# Patient Record
Sex: Female | Born: 1960 | Race: White | Hispanic: No | Marital: Married | Smoking: Former smoker
Health system: Southern US, Community
[De-identification: ages and names within clinical notes are randomized; demographics above are authoritative.]

## PROBLEM LIST (undated history)

## (undated) DIAGNOSIS — Z8781 Personal history of (healed) traumatic fracture: Secondary | ICD-10-CM

## (undated) DIAGNOSIS — K5790 Diverticulosis of intestine, part unspecified, without perforation or abscess without bleeding: Secondary | ICD-10-CM

## (undated) DIAGNOSIS — Z8619 Personal history of other infectious and parasitic diseases: Secondary | ICD-10-CM

## (undated) HISTORY — DX: Personal history of (healed) traumatic fracture: Z87.81

## (undated) HISTORY — DX: Diverticulosis of intestine, part unspecified, without perforation or abscess without bleeding: K57.90

## (undated) HISTORY — DX: Personal history of other infectious and parasitic diseases: Z86.19

---

## 1998-08-09 HISTORY — PX: TUBAL LIGATION: SHX77

## 2009-02-12 ENCOUNTER — Ambulatory Visit: Payer: Self-pay | Admitting: Sports Medicine

## 2010-09-15 LAB — HM DEXA SCAN: HM DEXA SCAN: NORMAL

## 2010-10-12 LAB — HM COLONOSCOPY

## 2013-04-17 LAB — CBC AND DIFFERENTIAL
HCT: 38 % (ref 36–46)
Hemoglobin: 12.8 g/dL (ref 12.0–16.0)
PLATELETS: 244 10*3/uL (ref 150–399)
WBC: 6.4 10*3/mL

## 2013-04-17 LAB — BASIC METABOLIC PANEL
BUN: 16 mg/dL (ref 4–21)
CREATININE: 0.8 mg/dL (ref 0.5–1.1)
Glucose: 100 mg/dL
Potassium: 3.7 mmol/L (ref 3.4–5.3)
SODIUM: 142 mmol/L (ref 137–147)

## 2013-04-17 LAB — HEPATIC FUNCTION PANEL
ALT: 14 U/L (ref 7–35)
AST: 8 U/L — AB (ref 13–35)

## 2014-01-02 LAB — HM PAP SMEAR: HM Pap smear: NEGATIVE

## 2014-01-09 LAB — HM MAMMOGRAPHY

## 2014-04-16 ENCOUNTER — Ambulatory Visit: Payer: Self-pay | Admitting: Orthopedic Surgery

## 2015-08-21 ENCOUNTER — Encounter: Payer: Self-pay | Admitting: Family Medicine

## 2015-08-21 ENCOUNTER — Ambulatory Visit (INDEPENDENT_AMBULATORY_CARE_PROVIDER_SITE_OTHER): Payer: 59 | Admitting: Family Medicine

## 2015-08-21 VITALS — BP 102/60 | HR 76 | Temp 98.5°F | Resp 16 | Wt 168.0 lb

## 2015-08-21 DIAGNOSIS — E669 Obesity, unspecified: Secondary | ICD-10-CM | POA: Insufficient documentation

## 2015-08-21 DIAGNOSIS — M5412 Radiculopathy, cervical region: Secondary | ICD-10-CM | POA: Insufficient documentation

## 2015-08-21 DIAGNOSIS — R05 Cough: Secondary | ICD-10-CM

## 2015-08-21 DIAGNOSIS — R059 Cough, unspecified: Secondary | ICD-10-CM

## 2015-08-21 DIAGNOSIS — Z8781 Personal history of (healed) traumatic fracture: Secondary | ICD-10-CM | POA: Insufficient documentation

## 2015-08-21 DIAGNOSIS — Z87891 Personal history of nicotine dependence: Secondary | ICD-10-CM | POA: Insufficient documentation

## 2015-08-21 DIAGNOSIS — J40 Bronchitis, not specified as acute or chronic: Secondary | ICD-10-CM

## 2015-08-21 DIAGNOSIS — K5730 Diverticulosis of large intestine without perforation or abscess without bleeding: Secondary | ICD-10-CM | POA: Insufficient documentation

## 2015-08-21 MED ORDER — AZITHROMYCIN 250 MG PO TABS: ORAL_TABLET | ORAL | Status: AC

## 2015-08-21 MED ORDER — HYDROCODONE-HOMATROPINE 5-1.5 MG/5ML PO SYRP
5.0000 mL | ORAL_SOLUTION | Freq: Three times a day (TID) | ORAL | Status: DC | PRN
Start: 2015-08-21 — End: 2016-09-21

## 2015-08-21 NOTE — Patient Instructions (Signed)

## 2015-08-21 NOTE — Progress Notes (Signed)
       Patient: Shelly Hicks Female    DOB: 1960/12/29   55 y.o.   MRN: [id number] Visit Date: 08/21/2015  Today's Provider: Lelon Huh, MD   Chief Complaint  Patient presents with  . Cough   Subjective:    Cough This is a new problem. The current episode started 1 to 4 weeks ago (x2 weeks). The problem has been unchanged. The problem occurs every few minutes. The cough is non-productive. Associated symptoms include headaches, nasal congestion, postnasal drip and rhinorrhea. Pertinent negatives include no chest pain, chills, ear congestion, ear pain, fever, heartburn, hemoptysis, myalgias, rash, sore throat, shortness of breath, sweats or wheezing. Associated symptoms comments: Heavy feeling in her chest when she coughs. The symptoms are aggravated by lying down (heat). She has tried prescription cough suppressant (mucinex and alka seltzer) for the symptoms. The treatment provided no relief. Her past medical history is significant for bronchitis.   Cough started 2 weeks ago with sinus pressure. Cough has lingered, has pressure in chest while coughing. Cough is worse at night. Productive of yellow sputum.    No Known Allergies Previous Medications   No medications on file    Review of Systems  Constitutional: Negative for fever, chills, appetite change and fatigue.  HENT: Positive for congestion, postnasal drip, rhinorrhea and sinus pressure. Negative for ear discharge, ear pain and sore throat.   Respiratory: Positive for cough. Negative for hemoptysis, chest tightness, shortness of breath and wheezing.   Cardiovascular: Negative for chest pain and palpitations.  Gastrointestinal: Negative for heartburn, nausea, vomiting and abdominal pain.  Musculoskeletal: Negative for myalgias.  Skin: Negative for rash.  Neurological: Positive for headaches. Negative for dizziness and weakness.    Social History  Substance Use Topics  . Smoking status: Former Smoker -- 1.00 packs/day      Types: Cigarettes    Quit date: 08/09/2009  . Smokeless tobacco: Not on file  . Alcohol Use: 0.0 oz/week    0 Standard drinks or equivalent per week     Comment: occasional use   Objective:   BP 102/60 mmHg  Pulse 76  Temp(Src) 98.5 F (36.9 C) (Oral)  Resp 16  Wt 168 lb (76.204 kg)  LMP 08/20/2010 (Approximate)  Physical Exam  General Appearance:    Alert, cooperative, no distress  HENT:   ENT exam normal, no neck nodes or sinus tenderness, neck without nodes, sinuses nontender and nasal mucosa pale and congested  Eyes:    PERRL, conjunctiva/corneas clear, EOM's intact       Lungs:     Occasional expiratory wheeze. , respirations unlabored  Heart:    Regular rate and rhythm  Neurologic:   Awake, alert, oriented x 3. No apparent focal neurological           defect.           Assessment & Plan:     1. Cough  - HYDROcodone-homatropine (HYCODAN) 5-1.5 MG/5ML syrup; Take 5 mLs by mouth every 8 (eight) hours as needed for cough.  Dispense: 120 mL; Refill: 0  2. Bronchitis  - azithromycin (ZITHROMAX) 250 MG tablet; 2 by mouth today, then 1 daily for 4 days  Dispense: 6 tablet; Refill: 0  Call if symptoms change or if not rapidly improving.           Lelon Huh, MD  [facility]

## 2016-04-09 HISTORY — PX: SHOULDER SURGERY: SHX246

## 2016-09-21 ENCOUNTER — Ambulatory Visit (INDEPENDENT_AMBULATORY_CARE_PROVIDER_SITE_OTHER): Payer: 59 | Admitting: Family Medicine

## 2016-09-21 ENCOUNTER — Encounter: Payer: Self-pay | Admitting: Family Medicine

## 2016-09-21 VITALS — BP 120/80 | HR 83 | Temp 98.2°F | Resp 16 | Wt 171.0 lb

## 2016-09-21 DIAGNOSIS — J329 Chronic sinusitis, unspecified: Secondary | ICD-10-CM | POA: Diagnosis not present

## 2016-09-21 MED ORDER — AZITHROMYCIN 250 MG PO TABS: ORAL_TABLET | ORAL | 0 refills | Status: AC

## 2016-09-21 NOTE — Patient Instructions (Signed)

## 2016-09-21 NOTE — Progress Notes (Signed)
       Patient: Shelly Hicks Female    DOB: [birth year]   56 y.o.   MRN: [id number] Visit Date: 09/21/2016  Today's Provider: Lelon Huh, MD   Chief Complaint  Patient presents with  . Sinusitis   Subjective:    Patient stated 4 days ago she started feeling feverish and chilled. Then over the weekend she developed a cough. Patient stated that yesterday sinus pressure and headache started. Believes she had a fever 4 days ago. Patient has been taking otc tylenol flu and antihistamines with mild to no relief.   Sinusitis  This is a new problem. The current episode started in the past 7 days (4 days ago). The problem has been gradually worsening since onset. There has been no fever. The pain is moderate. Associated symptoms include chills, congestion, coughing, headaches, sinus pressure and sneezing. Pertinent negatives include no diaphoresis, ear pain, hoarse voice, neck pain, shortness of breath, sore throat or swollen glands. Treatments tried: tylenol flu. The treatment provided mild relief.      No Known Allergies   Current Outpatient Prescriptions:  .  HYDROcodone-homatropine (HYCODAN) 5-1.5 MG/5ML syrup, Take 5 mLs by mouth every 8 (eight) hours as needed for cough., Disp: 120 mL, Rfl: 0  Review of Systems  Constitutional: Positive for chills. Negative for appetite change, diaphoresis, fatigue and fever.  HENT: Positive for congestion, sinus pain, sinus pressure and sneezing. Negative for ear pain, hoarse voice and sore throat.   Respiratory: Positive for cough. Negative for chest tightness, shortness of breath and wheezing.   Cardiovascular: Negative for chest pain and palpitations.  Gastrointestinal: Negative for abdominal pain, nausea and vomiting.  Musculoskeletal: Negative for neck pain.  Neurological: Positive for headaches. Negative for dizziness and weakness.    Social History  Substance Use Topics  . Smoking status: Former Smoker    Packs/day: 1.00   Types: Cigarettes    Quit date: 08/09/2009  . Smokeless tobacco: Never Used  . Alcohol use 0.0 oz/week     Comment: occasional use   Objective:   BP 120/80 (BP Location: Right Arm, Patient Position: Sitting, Cuff Size: Normal)   Pulse 83   Temp 98.2 F (36.8 C) (Oral)   Resp 16   Wt 171 lb (77.6 kg)   LMP 08/20/2010 (Approximate)   SpO2 98%   BMI 26.78 kg/m   Physical Exam  General Appearance:    Alert, cooperative, no distress  HENT:   bilateral TM normal without fluid or infection, neck without nodes, fronal sinuses tender and nasal mucosa pale and congested  Eyes:    PERRL, conjunctiva/corneas clear, EOM's intact       Lungs:     Clear to auscultation bilaterally, respirations unlabored  Heart:    Regular rate and rhythm  Neurologic:   Awake, alert, oriented x 3. No apparent focal neurological           defect.           Assessment & Plan:     1. Sinusitis, unspecified chronicity, unspecified location  - azithromycin (ZITHROMAX) 250 MG tablet; 2 by mouth today, then 1 daily for 4 days  Dispense: 6 tablet; Refill: 0  Call if symptoms change or if not rapidly improving.          Lelon Huh, MD  [facility]

## 2016-12-17 ENCOUNTER — Encounter: Payer: Self-pay | Admitting: Family Medicine

## 2016-12-17 ENCOUNTER — Ambulatory Visit (INDEPENDENT_AMBULATORY_CARE_PROVIDER_SITE_OTHER): Payer: 59 | Admitting: Family Medicine

## 2016-12-17 VITALS — BP 102/72 | HR 66 | Temp 98.2°F | Wt 169.2 lb

## 2016-12-17 DIAGNOSIS — R350 Frequency of micturition: Secondary | ICD-10-CM | POA: Diagnosis not present

## 2016-12-17 DIAGNOSIS — N3090 Cystitis, unspecified without hematuria: Secondary | ICD-10-CM

## 2016-12-17 LAB — POCT URINALYSIS DIPSTICK
BILIRUBIN UA: NEGATIVE
Glucose, UA: NEGATIVE
Ketones, UA: NEGATIVE
NITRITE UA: NEGATIVE
UROBILINOGEN UA: 0.2 U/dL
pH, UA: 6 (ref 5.0–8.0)

## 2016-12-17 MED ORDER — NITROFURANTOIN MONOHYD MACRO 100 MG PO CAPS: 100.0000 mg | ORAL_CAPSULE | Freq: Two times a day (BID) | ORAL | 0 refills | Status: DC

## 2016-12-17 NOTE — Progress Notes (Signed)
   Patient: Shelly Hicks Female    DOB: 06/09/1961   56 y.o.   MRN: [id number] Visit Date: 12/17/2016  Today's Provider: Vernie Murders, PA   Chief Complaint  Patient presents with  . Urinary Tract Infection   Subjective:    Dysuria   This is a new problem. Episode onset: couple weeks ago. The problem has been gradually worsening. Quality: pressure. Associated symptoms include frequency. Associated symptoms comments: Pressure . She has tried nothing for the symptoms.   Past Medical History:  Diagnosis Date  . Diverticulosis   . History of chicken pox   . History of measles   . History of mumps   . History of rib fracture    Past Surgical History:  Procedure Laterality Date  . TUBAL LIGATION  2000   Family History  Problem Relation Age of Onset  . Heart disease Neg Hx   . Breast cancer Neg Hx   . Colon cancer Neg Hx    No Known Allergies   Previous Medications   No medications on file    Review of Systems  Constitutional: Negative.   Respiratory: Negative.   Cardiovascular: Negative.   Genitourinary: Positive for dysuria and frequency.    Social History  Substance Use Topics  . Smoking status: Former Smoker    Packs/day: 1.00    Types: Cigarettes    Quit date: 08/09/2009  . Smokeless tobacco: Never Used  . Alcohol use 0.0 oz/week     Comment: occasional use   Objective:   BP 102/72 (BP Location: Right Arm, Patient Position: Sitting, Cuff Size: Normal)   Pulse 66   Temp 98.2 F (36.8 C) (Oral)   Wt 169 lb 3.2 oz (76.7 kg)   LMP 08/20/2010 (Approximate)   SpO2 97%   BMI 26.50 kg/m   Physical Exam  Constitutional: She is oriented to person, place, and time. She appears well-developed and well-nourished. No distress.  HENT:  Head: Normocephalic and atraumatic.  Right Ear: Hearing normal.  Left Ear: Hearing normal.  Nose: Nose normal.  Eyes: Conjunctivae and lids are normal. Right eye exhibits no discharge. Left eye exhibits no discharge. No  scleral icterus.  Pulmonary/Chest: Effort normal. No respiratory distress.  Abdominal: Soft. Bowel sounds are normal. There is no tenderness.  Musculoskeletal: Normal range of motion.  Neurological: She is alert and oriented to person, place, and time.  Skin: Skin is intact. No lesion and no rash noted.  Psychiatric: She has a normal mood and affect. Her speech is normal and behavior is normal. Thought content normal.      Assessment & Plan:     1. Frequent urination Onset over the past 2 weeks. No burning or stinging with urination. No gross hematuria. Urinalysis showed TNTC WBC's with RBC's and 1-2+ bacteria/hpf. Will get urine culture and treat as cystitis.  - POCT Urinalysis Dipstick  2. Cystitis No significant abdominal tenderness or CVA tenderness to percussion posteriorly. No fever. Urinalysis as above. Treat with Macrobid and may add AZO-Standard prn discomfort. Increase fluid intake and recheck pending culture report. - Urine culture - nitrofurantoin, macrocrystal-monohydrate, (MACROBID) 100 MG capsule; Take 1 capsule (100 mg total) by mouth 2 (two) times daily.  Dispense: 14 capsule; Refill: 0

## 2016-12-17 NOTE — Patient Instructions (Signed)

## 2016-12-19 LAB — URINE CULTURE

## 2016-12-20 ENCOUNTER — Telehealth: Payer: Self-pay

## 2016-12-20 NOTE — Telephone Encounter (Signed)
LMTCB  Thanks,  -Agape Hardiman 

## 2016-12-20 NOTE — Telephone Encounter (Signed)
-----   Message from Margo Common, [place] sent at 12/20/2016  8:21 AM EDT ----- Culture isolated E.coli bacteria that is sensitive to the Nitrofurantoin (Macrobid) given. Finish all the antibiotic and recheck urinalysis if any symptoms remain in 10 days.

## 2016-12-21 NOTE — Telephone Encounter (Signed)
Advised pt of lab results. Pt verbally acknowledges understanding. Emily Drozdowski, CMA   

## 2016-12-27 ENCOUNTER — Ambulatory Visit (INDEPENDENT_AMBULATORY_CARE_PROVIDER_SITE_OTHER): Payer: 59 | Admitting: Family Medicine

## 2016-12-27 ENCOUNTER — Encounter: Payer: Self-pay | Admitting: Family Medicine

## 2016-12-27 ENCOUNTER — Encounter: Payer: Self-pay | Admitting: *Deleted

## 2016-12-27 VITALS — BP 100/58 | HR 76 | Temp 98.3°F | Resp 16 | Ht 67.0 in | Wt 168.0 lb

## 2016-12-27 DIAGNOSIS — Z87891 Personal history of nicotine dependence: Secondary | ICD-10-CM

## 2016-12-27 DIAGNOSIS — Z1159 Encounter for screening for other viral diseases: Secondary | ICD-10-CM | POA: Diagnosis not present

## 2016-12-27 DIAGNOSIS — Z0000 Encounter for general adult medical examination without abnormal findings: Secondary | ICD-10-CM | POA: Diagnosis not present

## 2016-12-27 DIAGNOSIS — Z23 Encounter for immunization: Secondary | ICD-10-CM

## 2016-12-27 NOTE — Patient Instructions (Signed)

## 2016-12-27 NOTE — Progress Notes (Signed)
Patient: Shelly Hicks, Female    DOB: Jan 16, 1961, 56 y.o.   MRN: [id number] Visit Date: 12/27/2016  Today's Provider: Lelon Huh, MD   Chief Complaint  Patient presents with  . Annual Exam   Subjective:    Annual physical exam Shelly Hicks is a 56 y.o. female who presents today for health maintenance and complete physical. She feels well. She reports exercising yes. She reports she is sleeping well.  -----------------------------------------------------------------   Review of Systems  Constitutional: Negative for chills, fatigue and fever.  HENT: Negative for congestion, ear pain, rhinorrhea, sneezing and sore throat.   Eyes: Negative.  Negative for pain and redness.  Respiratory: Negative for cough, shortness of breath and wheezing.   Cardiovascular: Negative for chest pain and leg swelling.  Gastrointestinal: Negative for abdominal pain, blood in stool, constipation, diarrhea and nausea.  Endocrine: Negative for polydipsia and polyphagia.  Genitourinary: Positive for frequency. Negative for dysuria, flank pain, hematuria, pelvic pain, vaginal bleeding and vaginal discharge.  Musculoskeletal: Negative for arthralgias, back pain, gait problem and joint swelling.  Skin: Negative for rash.  Neurological: Negative.  Negative for dizziness, tremors, seizures, weakness, light-headedness, numbness and headaches.  Hematological: Negative for adenopathy.  Psychiatric/Behavioral: Negative.  Negative for behavioral problems, confusion and dysphoric mood. The patient is not nervous/anxious and is not hyperactive.   All other systems reviewed and are negative.   Social History      She  reports that she quit smoking about 7 years ago. Her smoking use included Cigarettes. She smoked 1.00 pack per day. She has never used smokeless tobacco. She reports that she drinks alcohol. She reports that she does not use drugs.       Social History   Social History  . Marital  status: Married    Spouse name: N/A  . Number of children: 2  . Years of education: N/A   Social History Main Topics  . Smoking status: Former Smoker    Packs/day: 1.00    Types: Cigarettes    Quit date: 08/09/2009  . Smokeless tobacco: Never Used  . Alcohol use 0.0 oz/week     Comment: occasional use  . Drug use: No  . Sexual activity: Not Asked   Other Topics Concern  . None   Social History Narrative  . None    Past Medical History:  Diagnosis Date  . Diverticulosis   . History of chicken pox   . History of measles   . History of mumps   . History of rib fracture      Patient Active Problem List   Diagnosis Date Noted  . Diverticulosis of colon 08/21/2015  . History of tobacco use 08/21/2015  . History of rib fracture 08/21/2015  . Mild obesity 08/21/2015  . Radiculopathy of cervical region 08/21/2015    Past Surgical History:  Procedure Laterality Date  . TUBAL LIGATION  2000    Family History        Family Status  Relation Status  . Mother Alive  . Father Deceased at age 47  . Brother Alive  . Neg Hx (Not Specified)        Her family history is not on file.     No Known Allergies  No current outpatient prescriptions on file.   Patient Care Team: Birdie Sons, MD as PCP - General (Family Medicine) Caryn Section Kirstie Peri, MD as Referring Physician (Family Medicine) System, Provider Not In  Objective:   Vitals: BP (!) 100/58 (BP Location: Right Arm, Patient Position: Sitting, Cuff Size: Normal)   Pulse 76   Temp 98.3 F (36.8 C) (Oral)   Resp 16   Ht 5\' 7"  (1.702 m)   Wt 168 lb (76.2 kg)   LMP 08/20/2010 (Approximate)   SpO2 98%   BMI 26.31 kg/m    Vitals:   12/27/16 1409  BP: (!) 100/58  Pulse: 76  Resp: 16  Temp: 98.3 F (36.8 C)  TempSrc: Oral  SpO2: 98%  Weight: 168 lb (76.2 kg)  Height: 5\' 7"  (1.702 m)     Physical Exam   General Appearance:    Alert, cooperative, no distress, appears stated age  Head:     Normocephalic, without obvious abnormality, atraumatic  Eyes:    PERRL, conjunctiva/corneas clear, EOM's intact, fundi    benign, both eyes  Ears:    Normal TM's and external ear canals, both ears  Nose:   Nares normal, septum midline, mucosa normal, no drainage    or sinus tenderness  Throat:   Lips, mucosa, and tongue normal; teeth and gums normal  Neck:   Supple, symmetrical, trachea midline, no adenopathy;    thyroid:  no enlargement/tenderness/nodules; no carotid   bruit or JVD  Back:     Symmetric, no curvature, ROM normal, no CVA tenderness  Lungs:     Clear to auscultation bilaterally, respirations unlabored  Chest Wall:    No tenderness or deformity   Heart:    Regular rate and rhythm, S1 and S2 normal, no murmur, rub   or gallop  Breast Exam:    normal appearance, no masses or tenderness  Abdomen:     Soft, non-tender, bowel sounds active all four quadrants,    no masses, no organomegaly  Pelvic:    deferred  Extremities:   Extremities normal, atraumatic, no cyanosis or edema  Pulses:   2+ and symmetric all extremities  Skin:   Skin color, texture, turgor normal, no rashes or lesions  Lymph nodes:   Cervical, supraclavicular, and axillary nodes normal  Neurologic:   CNII-XII intact, normal strength, sensation and reflexes    throughout    Depression Screen PHQ 2/9 Scores 12/27/2016  PHQ - 2 Score 0  PHQ- 9 Score 0      Assessment & Plan:     Routine Health Maintenance and Physical Exam  Exercise Activities and Dietary recommendations Goals    None      Immunization History  Administered Date(s) Administered  . Influenza-Unspecified 05/30/2015  . Tdap 04/27/2006    Health Maintenance  Topic Date Due  . Hepatitis C Screening  [birth year]  . HIV Screening  09/03/1974  . MAMMOGRAM  09/03/2009  . TETANUS/TDAP  04/27/2016  . PAP SMEAR  01/02/2017  . INFLUENZA VACCINE  03/09/2017  . COLONOSCOPY  10/11/2020     Discussed health benefits of physical  activity, and encouraged her to engage in regular exercise appropriate for her age and condition.    --------------------------------------------------------------------  1. Annual physical exam Patient given contact information to schedule mammogram.  - Comprehensive metabolic panel - Lipid panel  2. Need for hepatitis C screening test  - Hepatitis C antibody  3. History of smoking 30 or more pack years  - CT CHEST LUNG CANCER SCREENING LOW DOSE WO CONTRAST; Future  4. Need for Td vaccine  - Td : Tetanus/diphtheria >7yo Preservative  free   Lelon Huh, MD  [facility]

## 2016-12-28 ENCOUNTER — Telehealth: Payer: Self-pay

## 2016-12-28 ENCOUNTER — Telehealth: Payer: Self-pay | Admitting: *Deleted

## 2016-12-28 DIAGNOSIS — Z87891 Personal history of nicotine dependence: Secondary | ICD-10-CM

## 2016-12-28 LAB — COMPREHENSIVE METABOLIC PANEL
ALT: 17 IU/L (ref 0–32)
AST: 14 IU/L (ref 0–40)
Albumin/Globulin Ratio: 1.4 (ref 1.2–2.2)
Albumin: 4.3 g/dL (ref 3.5–5.5)
Alkaline Phosphatase: 89 IU/L (ref 39–117)
BILIRUBIN TOTAL: 0.3 mg/dL (ref 0.0–1.2)
BUN/Creatinine Ratio: 24 — ABNORMAL HIGH (ref 9–23)
BUN: 16 mg/dL (ref 6–24)
CHLORIDE: 103 mmol/L (ref 96–106)
CO2: 26 mmol/L (ref 18–29)
Calcium: 9.4 mg/dL (ref 8.7–10.2)
Creatinine, Ser: 0.67 mg/dL (ref 0.57–1.00)
GFR calc non Af Amer: 98 mL/min/{1.73_m2} (ref >59)
GFR, EST AFRICAN AMERICAN: 113 mL/min/{1.73_m2} (ref >59)
Globulin, Total: 3 g/dL (ref 1.5–4.5)
Glucose: 103 mg/dL — ABNORMAL HIGH (ref 65–99)
POTASSIUM: 4.1 mmol/L (ref 3.5–5.2)
Sodium: 141 mmol/L (ref 134–144)
TOTAL PROTEIN: 7.3 g/dL (ref 6.0–8.5)

## 2016-12-28 LAB — LIPID PANEL
CHOLESTEROL TOTAL: 233 mg/dL — AB (ref 100–199)
Chol/HDL Ratio: 5.5 ratio — ABNORMAL HIGH (ref 0.0–4.4)
HDL: 42 mg/dL (ref >39)
LDL Calculated: 136 mg/dL — ABNORMAL HIGH (ref 0–99)
Triglycerides: 276 mg/dL — ABNORMAL HIGH (ref 0–149)
VLDL CHOLESTEROL CAL: 55 mg/dL — AB (ref 5–40)

## 2016-12-28 LAB — HEPATITIS C ANTIBODY: Hep C Virus Ab: <0.1 s/co ratio (ref 0.0–0.9)

## 2016-12-28 NOTE — Telephone Encounter (Signed)
-----   Message from Birdie Sons, MD sent at 12/28/2016  7:44 AM EDT ----- Blood sugar, kidney functions, electrolytes are all normal. Cholesterol mildly elevated at 233. Try to cut  Back on saturated fats in diet. Check labs yearly.

## 2016-12-28 NOTE — Telephone Encounter (Signed)
Received referral for initial lung cancer screening scan. Contacted patient and obtained smoking history,(former, quit 08/09/09, 30 pack year) as well as answering questions related to screening process. Patient denies signs of lung cancer such as weight loss or hemoptysis. Patient denies comorbidity that would prevent curative treatment if lung cancer were found. Patient is scheduled for shared decision making visit and CT scan on 01/04/17.

## 2016-12-28 NOTE — Telephone Encounter (Signed)
Pt advised.   Thanks,   -Chipper Koudelka

## 2016-12-28 NOTE — Telephone Encounter (Signed)
LMTCB 12/28/2016  Thanks,   -Mickel Baas

## 2017-01-04 ENCOUNTER — Encounter: Payer: Self-pay | Admitting: Oncology

## 2017-01-04 ENCOUNTER — Inpatient Hospital Stay: Payer: Self-pay | Admitting: Oncology

## 2017-01-04 ENCOUNTER — Ambulatory Visit: Payer: 59

## 2017-01-19 ENCOUNTER — Ambulatory Visit
Admission: RE | Admit: 2017-01-19 | Discharge: 2017-01-19 | Disposition: A | Discharge status: Home / Self Care | Payer: 59 | Source: Ambulatory Visit | Attending: Oncology | Admitting: Oncology

## 2017-01-19 ENCOUNTER — Inpatient Hospital Stay: Payer: 59 | Attending: Oncology | Admitting: Oncology

## 2017-01-19 DIAGNOSIS — Z122 Encounter for screening for malignant neoplasm of respiratory organs: Secondary | ICD-10-CM | POA: Insufficient documentation

## 2017-01-19 DIAGNOSIS — Z87891 Personal history of nicotine dependence: Secondary | ICD-10-CM | POA: Insufficient documentation

## 2017-01-19 DIAGNOSIS — I700 Atherosclerosis of aorta: Secondary | ICD-10-CM | POA: Diagnosis not present

## 2017-01-21 ENCOUNTER — Encounter: Payer: Self-pay | Admitting: Family Medicine

## 2017-01-21 ENCOUNTER — Encounter: Payer: Self-pay | Admitting: *Deleted

## 2017-01-21 DIAGNOSIS — Z87891 Personal history of nicotine dependence: Secondary | ICD-10-CM | POA: Insufficient documentation

## 2017-01-21 DIAGNOSIS — I700 Atherosclerosis of aorta: Secondary | ICD-10-CM | POA: Insufficient documentation

## 2017-01-21 NOTE — Progress Notes (Signed)
In accordance with CMS guidelines, patient has met eligibility criteria including age, absence of signs or symptoms of lung cancer.  Social History  Substance Use Topics  . Smoking status: Former Smoker    Packs/day: 1.00    Years: 30.00    Types: Cigarettes    Quit date: 08/09/2009  . Smokeless tobacco: Never Used  . Alcohol use 0.0 oz/week     Comment: occasional use     A shared decision-making session was conducted prior to the performance of CT scan. This includes one or more decision aids, includes benefits and harms of screening, follow-up diagnostic testing, over-diagnosis, false positive rate, and total radiation exposure.  Counseling on the importance of adherence to annual lung cancer LDCT screening, impact of co-morbidities, and ability or willingness to undergo diagnosis and treatment is imperative for compliance of the program.  Counseling on the importance of continued smoking cessation for former smokers; the importance of smoking cessation for current smokers, and information about tobacco cessation interventions have been given to patient including [place] and 1800 quit [place] programs.  Written order for lung cancer screening with LDCT has been given to the patient and any and all questions have been answered to the best of my abilities.   Yearly follow up will be coordinated by Burgess Estelle, Thoracic Navigator.

## 2017-04-01 DIAGNOSIS — Z1231 Encounter for screening mammogram for malignant neoplasm of breast: Secondary | ICD-10-CM | POA: Diagnosis not present

## 2017-04-01 LAB — HM MAMMOGRAPHY

## 2017-04-08 ENCOUNTER — Encounter: Payer: Self-pay | Admitting: Family Medicine

## 2017-07-21 ENCOUNTER — Encounter: Payer: Self-pay | Admitting: Physician Assistant

## 2017-07-21 ENCOUNTER — Ambulatory Visit: Payer: 59 | Admitting: Physician Assistant

## 2017-07-21 VITALS — BP 122/84 | HR 60 | Temp 97.8°F | Resp 16 | Wt 167.0 lb

## 2017-07-21 DIAGNOSIS — J019 Acute sinusitis, unspecified: Secondary | ICD-10-CM

## 2017-07-21 MED ORDER — AMOXICILLIN-POT CLAVULANATE 875-125 MG PO TABS: 1.0000 | ORAL_TABLET | Freq: Two times a day (BID) | ORAL | 0 refills | Status: AC

## 2017-07-21 NOTE — Patient Instructions (Signed)

## 2017-07-21 NOTE — Progress Notes (Signed)
[place]  Chief Complaint  Patient presents with  . Sinusitis    Started about a week ago    Subjective:    Patient ID: Shelly Hicks, female    DOB: 1960/11/17, 56 y.o.   MRN: [id number]  Upper Respiratory Infection: Shelly Hicks is a 56 y.o. female with a past medical history significant for 30 pack year smoking complaining of possible sinusitis. Symptoms include congestion, cough and sore throat. Onset of symptoms was 1 week ago, unchanged since that time. She also c/o cough described as productive, post nasal drip and sinus pressure for the past 1 week .  She is drinking plenty of fluids. Evaluation to date: none. Treatment to date: decongestants. The treatment has provided no relief.   Review of Systems  Constitutional: Positive for chills and fatigue. Negative for activity change, appetite change, diaphoresis, fever and unexpected weight change.  HENT: Positive for congestion, postnasal drip, rhinorrhea, sinus pressure, sinus pain and sneezing. Negative for ear discharge, ear pain, hearing loss, nosebleeds, sore throat (Pt had a sore throat a few days ago but has improved. ), tinnitus, trouble swallowing and voice change.   Eyes: Positive for discharge and itching. Negative for photophobia, pain, redness and visual disturbance.  Respiratory: Positive for cough. Negative for apnea, choking, chest tightness, shortness of breath, wheezing and stridor.   Gastrointestinal: Negative.   Neurological: Positive for headaches. Negative for dizziness and light-headedness.       Objective:   LMP 08/20/2010 (Approximate)   Patient Active Problem List   Diagnosis Date Noted  . Aortic atherosclerosis ([place]) 01/21/2017  . Personal history of tobacco use, presenting hazards to health 01/21/2017  . Diverticulosis of colon 08/21/2015  . History of smoking 30 or more pack years 08/21/2015  . History of rib fracture 08/21/2015  . Mild obesity  08/21/2015  . Radiculopathy of cervical region 08/21/2015    No outpatient encounter medications on file as of 07/21/2017.   No facility-administered encounter medications on file as of 07/21/2017.     No Known Allergies     Physical Exam  Constitutional: She is oriented to person, place, and time. She appears well-developed and well-nourished. No distress.  HENT:  Right Ear: External ear normal.  Left Ear: External ear normal.  Nose: Right sinus exhibits maxillary sinus tenderness and frontal sinus tenderness. Left sinus exhibits maxillary sinus tenderness and frontal sinus tenderness.  Mouth/Throat: Oropharynx is clear and moist. No oropharyngeal exudate, posterior oropharyngeal edema or posterior oropharyngeal erythema.  Tms opaque bilaterally   Eyes: Conjunctivae are normal. Right eye exhibits no discharge. Left eye exhibits no discharge.  Neck: Neck supple.  Cardiovascular: Normal rate and regular rhythm.  Pulmonary/Chest: Effort normal and breath sounds normal.  Lymphadenopathy:    She has cervical adenopathy.  Neurological: She is alert and oriented to person, place, and time.  Skin: Skin is warm and dry. She is not diaphoretic.  Psychiatric: She has a normal mood and affect. Her behavior is normal.       Assessment & Plan:  1. Acute non-recurrent sinusitis, unspecified location  - amoxicillin-clavulanate (AUGMENTIN) 875-125 MG tablet; Take 1 tablet by mouth 2 (two) times daily for 7 days.  Dispense: 14 tablet; Refill: 0  Return if symptoms worsen or fail to improve.  The entirety of the information documented in the History of Present Illness, Review of Systems and Physical Exam were personally obtained by me. Portions of this information were initially documented by Mickel Baas  Volanda Napoleon, CMA and reviewed by me for thoroughness and accuracy.

## 2017-08-15 ENCOUNTER — Ambulatory Visit: Payer: 59 | Admitting: Family Medicine

## 2017-08-15 ENCOUNTER — Encounter: Payer: Self-pay | Admitting: Family Medicine

## 2017-08-15 VITALS — BP 134/70 | HR 68 | Temp 98.0°F | Resp 16 | Wt 168.0 lb

## 2017-08-15 DIAGNOSIS — R059 Cough, unspecified: Secondary | ICD-10-CM

## 2017-08-15 DIAGNOSIS — R05 Cough: Secondary | ICD-10-CM

## 2017-08-15 DIAGNOSIS — J329 Chronic sinusitis, unspecified: Secondary | ICD-10-CM

## 2017-08-15 MED ORDER — HYDROCODONE-HOMATROPINE 5-1.5 MG/5ML PO SYRP: 5.0000 mL | ORAL_SOLUTION | Freq: Three times a day (TID) | ORAL | 0 refills | Status: DC | PRN

## 2017-08-15 MED ORDER — AZITHROMYCIN 250 MG PO TABS: ORAL_TABLET | ORAL | 0 refills | Status: AC

## 2017-08-15 NOTE — Progress Notes (Signed)
       Patient: Shelly Hicks Female    DOB: Jan 24, 1961   57 y.o.   MRN: [id number] Visit Date: 08/15/2017  Today's Provider: Lelon Huh, MD   Chief Complaint  Patient presents with  . Sinusitis   Subjective:    Sinusitis  This is a new problem. The current episode started in the past 7 days. The problem has been gradually worsening since onset. There has been no fever. Associated symptoms include congestion, coughing, headaches, a hoarse voice, sinus pressure and sneezing. Past treatments include oral decongestants and acetaminophen (mucinex cold and flu). The treatment provided no relief.   Patient was seen about 3 weeks ago by Fabio Bering and she was prescribed Augmentin. She reports that it helped initially, but her symptoms returned and worsened since then. Had bad sore throat and fever about a week ago, which has since resolved, but now having cough which is productive yellow and green discharge.     No Known Allergies  No current outpatient medications on file.  Review of Systems  HENT: Positive for congestion, hoarse voice, sinus pressure and sneezing.   Respiratory: Positive for cough.   Neurological: Positive for headaches.    Social History   Tobacco Use  . Smoking status: Former Smoker    Packs/day: 1.00    Years: 30.00    Pack years: 30.00    Types: Cigarettes    Last attempt to quit: 08/09/2009    Years since quitting: 8.0  . Smokeless tobacco: Never Used  Substance Use Topics  . Alcohol use: Yes    Alcohol/week: 0.0 oz    Comment: occasional use   Objective:   BP 134/70 (BP Location: Left Arm, Patient Position: Sitting, Cuff Size: Normal)   Pulse 68   Temp 98 F (36.7 C)   Resp 16   Wt 168 lb (76.2 kg)   LMP 08/20/2010 (Approximate)   SpO2 98%   BMI 26.31 kg/m  Vitals:   08/15/17 1527  BP: 134/70  Pulse: 68  Resp: 16  Temp: 98 F (36.7 C)  SpO2: 98%  Weight: 168 lb (76.2 kg)     Physical Exam  General Appearance:    Alert,  cooperative, no distress  HENT:   bilateral TM normal without fluid or infection, neck without nodes, frontal sinuses tender and nasal mucosa pale and congested  Eyes:    PERRL, conjunctiva/corneas clear, EOM's intact       Lungs:     Clear to auscultation bilaterally, rare expiratory wheeze, respirations unlabored  Heart:    Regular rate and rhythm  Neurologic:   Awake, alert, oriented x 3. No apparent focal neurological           defect.           Assessment & Plan:     1. Sinusitis, unspecified chronicity, unspecified location  - azithromycin (ZITHROMAX) 250 MG tablet; 2 by mouth today, then 1 daily for 4 days  Dispense: 6 tablet; Refill: 0  2. Cough  - HYDROcodone-homatropine (HYCODAN) 5-1.5 MG/5ML syrup; Take 5 mLs by mouth every 8 (eight) hours as needed for cough.  Dispense: 100 mL; Refill: 0       Lelon Huh, MD  [facility]

## 2017-10-27 ENCOUNTER — Ambulatory Visit: Payer: 59 | Admitting: Physician Assistant

## 2017-10-27 ENCOUNTER — Encounter: Payer: Self-pay | Admitting: Physician Assistant

## 2017-10-27 VITALS — BP 146/94 | HR 74 | Temp 98.4°F | Resp 16 | Wt 170.0 lb

## 2017-10-27 DIAGNOSIS — J069 Acute upper respiratory infection, unspecified: Secondary | ICD-10-CM | POA: Diagnosis not present

## 2017-10-27 MED ORDER — AZITHROMYCIN 250 MG PO TABS: ORAL_TABLET | ORAL | 0 refills | Status: DC

## 2017-10-27 NOTE — Progress Notes (Signed)
[place]  Chief Complaint  Patient presents with  . URI    Started about four days ago  . Sinusitis    Subjective:    Patient ID: Shelly Hicks, female    DOB: [birth year], 57 y.o.   MRN: [id number]  Upper Respiratory Infection: Shelly Hicks is a 57 y.o. female with a past medical history significant for 30 pack year smoking history complaining of symptoms of a URI, possible sinusitis. Symptoms include congestion, cough and sore throat. Onset of symptoms was 4 days ago, gradually worsening since that time. She also c/o congestion, nasal congestion, post nasal drip and sinus pressure for the past 4 days .  She is drinking plenty of fluids. Evaluation to date: none. Treatment to date: antihistamines. The treatment has provided minimal. She has taken zyrtec for two days and it is not working.  Review of Systems  Constitutional: Positive for fatigue. Negative for activity change, appetite change, chills, diaphoresis, fever and unexpected weight change.  HENT: Positive for congestion, postnasal drip, rhinorrhea, sinus pressure, sinus pain, sore throat and voice change. Negative for ear discharge, ear pain, nosebleeds, tinnitus and trouble swallowing.   Eyes: Positive for discharge. Negative for photophobia, pain, redness, itching and visual disturbance.  Respiratory: Positive for cough. Negative for apnea, choking, chest tightness, shortness of breath, wheezing and stridor.   Gastrointestinal: Negative.   Neurological: Positive for headaches. Negative for dizziness and light-headedness.  Hematological: Negative for adenopathy.       Objective:   BP (!) 146/94 (BP Location: Right Arm, Patient Position: Sitting, Cuff Size: Normal)   Pulse 74   Temp 98.4 F (36.9 C) (Oral)   Resp 16   Wt 170 lb (77.1 kg)   LMP 08/20/2010 (Approximate)   SpO2 98%   BMI 26.63 kg/m   Patient Active Problem List   Diagnosis Date Noted  . Aortic  atherosclerosis ([place]) 01/21/2017  . Personal history of tobacco use, presenting hazards to health 01/21/2017  . Diverticulosis of colon 08/21/2015  . History of smoking 30 or more pack years 08/21/2015  . History of rib fracture 08/21/2015  . Mild obesity 08/21/2015  . Radiculopathy of cervical region 08/21/2015    Outpatient Encounter Medications as of 10/27/2017  Medication Sig  . HYDROcodone-homatropine (HYCODAN) 5-1.5 MG/5ML syrup Take 5 mLs by mouth every 8 (eight) hours as needed for cough. (Patient not taking: Reported on 10/27/2017)   No facility-administered encounter medications on file as of 10/27/2017.     No Known Allergies     Physical Exam  Constitutional: She is oriented to person, place, and time. She appears well-developed and well-nourished.  HENT:  Right Ear: External ear normal.  Left Ear: External ear normal.  Hoarse voice.  Eyes: Conjunctivae are normal. Right eye exhibits discharge. Left eye exhibits discharge.  Clear discharge  Neck: Neck supple.  Cardiovascular: Normal rate and regular rhythm.  Pulmonary/Chest: Effort normal and breath sounds normal.  Lymphadenopathy:    She has no cervical adenopathy.  Neurological: She is alert and oriented to person, place, and time.  Skin: Skin is warm and dry.  Psychiatric: She has a normal mood and affect. Her behavior is normal.       Assessment & Plan:  1. URI with cough and congestion  Think this is a virus. Counseled on course and duration of virus and that she should call back if symptoms worsen or continue > 10 days. Counseled that two days of zyrtec is  not long enough to control any symptoms and that she should take it continuously. She should also add flonase for nasal congestion. Do not think she needs antibiotic, but she is insistent.   - azithromycin (ZITHROMAX) 250 MG tablet; Take 2 tabs on day one, take 1 tab on each of the following four days.  Dispense: 6 each; Refill: 0  Return if symptoms  worsen or fail to improve.  The entirety of the information documented in the History of Present Illness, Review of Systems and Physical Exam were personally obtained by me. Portions of this information were initially documented by Ashley Royalty, CMA and reviewed by me for thoroughness and accuracy.

## 2017-10-27 NOTE — Patient Instructions (Signed)
gViral Respiratory Infection A viral respiratory infection is an illness that affects parts of the body used for breathing, like the lungs, nose, and throat. It is caused by a germ called a virus. Some examples of this kind of infection are:  A cold.  The flu (influenza).  A respiratory syncytial virus (RSV) infection.  How do I know if I have this infection? Most of the time this infection causes:  A stuffy or runny nose.  Yellow or green fluid in the nose.  A cough.  Sneezing.  Tiredness (fatigue).  Achy muscles.  A sore throat.  Sweating or chills.  A fever.  A headache.  How is this infection treated? If the flu is diagnosed early, it may be treated with an antiviral medicine. This medicine shortens the length of time a person has symptoms. Symptoms may be treated with over-the-counter and prescription medicines, such as:  Expectorants. These make it easier to cough up mucus.  Decongestant nasal sprays.  Doctors do not prescribe antibiotic medicines for viral infections. They do not work with this kind of infection. How do I know if I should stay home? To keep others from getting sick, stay home if you have:  A fever.  A lasting cough.  A sore throat.  A runny nose.  Sneezing.  Muscles aches.  Headaches.  Tiredness.  Weakness.  Chills.  Sweating.  An upset stomach (nausea).  Follow these instructions at home:  Rest as much as possible.  Take over-the-counter and prescription medicines only as told by your doctor.  Drink enough fluid to keep your pee (urine) clear or pale yellow.  Gargle with salt water. Do this 3-4 times per day or as needed. To make a salt-water mixture, dissolve -1 tsp of salt in 1 cup of warm water. Make sure the salt dissolves all the way.  Use nose drops made from salt water. This helps with stuffiness (congestion). It also helps soften the skin around your nose.  Do not drink alcohol.  Do not use tobacco  products, including cigarettes, chewing tobacco, and e-cigarettes. If you need help quitting, ask your doctor. Get help if:  Your symptoms last for 10 days or longer.  Your symptoms get worse over time.  You have a fever.  You have very bad pain in your face or forehead.  Parts of your jaw or neck become very swollen. Get help right away if:  You feel pain or pressure in your chest.  You have shortness of breath.  You faint or feel like you will faint.  You keep throwing up (vomiting).  You feel confused. This information is not intended to replace advice given to you by your health care provider. Make sure you discuss any questions you have with your health care provider. Document Released: 07/08/2008 Document Revised: 01/01/2016 Document Reviewed: 01/01/2015 Elsevier Interactive Patient Education  2018 [employer].

## 2018-01-17 ENCOUNTER — Telehealth: Payer: Self-pay | Admitting: *Deleted

## 2018-01-17 NOTE — Telephone Encounter (Signed)
Left message for patient to notify them that it is time to schedule annual low dose lung cancer screening CT scan. Instructed patient to call back to verify information prior to the scan being scheduled.  

## 2018-01-31 ENCOUNTER — Telehealth: Payer: Self-pay | Admitting: *Deleted

## 2018-01-31 DIAGNOSIS — Z122 Encounter for screening for malignant neoplasm of respiratory organs: Secondary | ICD-10-CM

## 2018-01-31 DIAGNOSIS — Z87891 Personal history of nicotine dependence: Secondary | ICD-10-CM

## 2018-01-31 NOTE — Telephone Encounter (Signed)
Notified patient that annual lung cancer screening low dose CT scan is due currently or will be in near future. Confirmed that patient is within the age range of 55-77, and asymptomatic, (no signs or symptoms of lung cancer). Patient denies illness that would prevent curative treatment for lung cancer if found. Verified smoking history, (former, quit 2011, 30 pack year). The shared decision making visit was done 01/19/17. Patient is agreeable for CT scan being scheduled.

## 2018-02-07 ENCOUNTER — Telehealth: Payer: Self-pay | Admitting: Family Medicine

## 2018-02-07 MED ORDER — ALPRAZOLAM 0.25 MG PO TABS: 0.2500 mg | ORAL_TABLET | Freq: Three times a day (TID) | ORAL | 0 refills | Status: AC | PRN

## 2018-02-07 NOTE — Telephone Encounter (Signed)
Please advise 

## 2018-02-07 NOTE — Telephone Encounter (Signed)
Have sent prescription for alprazolam to her pharmacy.

## 2018-02-07 NOTE — Telephone Encounter (Signed)
Patient called to see if Dr. Caryn Section could call something in for her. I did check schedule first available is 02/15/18 and patient is requesting medication to be called in sooner or if Dr. Caryn Section can see her this week.

## 2018-02-07 NOTE — Telephone Encounter (Signed)
Pt called saying her mom passed away last week and she wants to know if you can give her something tempoary to help her with crying and being upset.  Call back [phone]  [facility] use [facility]  [address]

## 2018-02-08 NOTE — Telephone Encounter (Signed)
Patient was advised.  

## 2018-02-13 ENCOUNTER — Ambulatory Visit: Admission: RE | Admit: 2018-02-13 | Payer: 59 | Source: Ambulatory Visit

## 2018-03-09 ENCOUNTER — Telehealth: Payer: Self-pay | Admitting: *Deleted

## 2018-03-09 DIAGNOSIS — Z87891 Personal history of nicotine dependence: Secondary | ICD-10-CM

## 2018-03-09 NOTE — Telephone Encounter (Signed)
Notified patient that annual lung cancer screening low dose CT scan is due currently or will be in near future. Confirmed that patient is within the age range of 55-77, and asymptomatic, (no signs or symptoms of lung cancer). Patient denies illness that would prevent curative treatment for lung cancer if found. Verified smoking history, (former, quit 08/09/09, 30 pack year). The shared decision making visit was done 01/19/17. Patient is agreeable for CT scan being scheduled.

## 2018-03-14 ENCOUNTER — Ambulatory Visit: Payer: 59

## 2018-03-15 ENCOUNTER — Ambulatory Visit
Admission: RE | Admit: 2018-03-15 | Discharge: 2018-03-15 | Disposition: A | Discharge status: Home / Self Care | Payer: 59 | Source: Ambulatory Visit | Attending: Oncology | Admitting: Oncology

## 2018-03-15 DIAGNOSIS — I700 Atherosclerosis of aorta: Secondary | ICD-10-CM | POA: Insufficient documentation

## 2018-03-15 DIAGNOSIS — Z87891 Personal history of nicotine dependence: Secondary | ICD-10-CM | POA: Diagnosis not present

## 2018-03-15 DIAGNOSIS — Z122 Encounter for screening for malignant neoplasm of respiratory organs: Secondary | ICD-10-CM

## 2018-03-17 ENCOUNTER — Encounter: Payer: Self-pay | Admitting: *Deleted

## 2018-07-29 DIAGNOSIS — Z23 Encounter for immunization: Secondary | ICD-10-CM | POA: Diagnosis not present

## 2018-08-15 DIAGNOSIS — M9901 Segmental and somatic dysfunction of cervical region: Secondary | ICD-10-CM | POA: Diagnosis not present

## 2018-08-15 DIAGNOSIS — M62838 Other muscle spasm: Secondary | ICD-10-CM | POA: Diagnosis not present

## 2018-08-15 DIAGNOSIS — M5413 Radiculopathy, cervicothoracic region: Secondary | ICD-10-CM | POA: Diagnosis not present

## 2018-08-16 DIAGNOSIS — M62838 Other muscle spasm: Secondary | ICD-10-CM | POA: Diagnosis not present

## 2018-08-16 DIAGNOSIS — M5413 Radiculopathy, cervicothoracic region: Secondary | ICD-10-CM | POA: Diagnosis not present

## 2018-08-16 DIAGNOSIS — M9901 Segmental and somatic dysfunction of cervical region: Secondary | ICD-10-CM | POA: Diagnosis not present

## 2018-08-21 DIAGNOSIS — M62838 Other muscle spasm: Secondary | ICD-10-CM | POA: Diagnosis not present

## 2018-08-21 DIAGNOSIS — M5413 Radiculopathy, cervicothoracic region: Secondary | ICD-10-CM | POA: Diagnosis not present

## 2018-08-21 DIAGNOSIS — M9901 Segmental and somatic dysfunction of cervical region: Secondary | ICD-10-CM | POA: Diagnosis not present

## 2018-08-22 DIAGNOSIS — M9901 Segmental and somatic dysfunction of cervical region: Secondary | ICD-10-CM | POA: Diagnosis not present

## 2018-08-22 DIAGNOSIS — M62838 Other muscle spasm: Secondary | ICD-10-CM | POA: Diagnosis not present

## 2018-08-22 DIAGNOSIS — M5413 Radiculopathy, cervicothoracic region: Secondary | ICD-10-CM | POA: Diagnosis not present

## 2018-08-24 DIAGNOSIS — M5413 Radiculopathy, cervicothoracic region: Secondary | ICD-10-CM | POA: Diagnosis not present

## 2018-08-24 DIAGNOSIS — M62838 Other muscle spasm: Secondary | ICD-10-CM | POA: Diagnosis not present

## 2018-08-24 DIAGNOSIS — M9901 Segmental and somatic dysfunction of cervical region: Secondary | ICD-10-CM | POA: Diagnosis not present

## 2018-08-30 DIAGNOSIS — M62838 Other muscle spasm: Secondary | ICD-10-CM | POA: Diagnosis not present

## 2018-08-30 DIAGNOSIS — M9901 Segmental and somatic dysfunction of cervical region: Secondary | ICD-10-CM | POA: Diagnosis not present

## 2018-08-30 DIAGNOSIS — M5413 Radiculopathy, cervicothoracic region: Secondary | ICD-10-CM | POA: Diagnosis not present

## 2018-09-01 DIAGNOSIS — M62838 Other muscle spasm: Secondary | ICD-10-CM | POA: Diagnosis not present

## 2018-09-01 DIAGNOSIS — M5413 Radiculopathy, cervicothoracic region: Secondary | ICD-10-CM | POA: Diagnosis not present

## 2018-09-01 DIAGNOSIS — M9901 Segmental and somatic dysfunction of cervical region: Secondary | ICD-10-CM | POA: Diagnosis not present

## 2019-03-07 ENCOUNTER — Telehealth: Payer: Self-pay | Admitting: Family Medicine

## 2019-03-07 NOTE — Telephone Encounter (Signed)
Pt returned call for pre-screening for appt on 03/09/2019. Thanks TNP

## 2019-03-09 ENCOUNTER — Encounter: Payer: Self-pay | Admitting: Family Medicine

## 2019-03-09 ENCOUNTER — Ambulatory Visit (INDEPENDENT_AMBULATORY_CARE_PROVIDER_SITE_OTHER): Payer: 59 | Admitting: Family Medicine

## 2019-03-09 ENCOUNTER — Other Ambulatory Visit: Payer: Self-pay

## 2019-03-09 ENCOUNTER — Other Ambulatory Visit (HOSPITAL_COMMUNITY)
Admission: RE | Admit: 2019-03-09 | Discharge: 2019-03-09 | Disposition: A | Discharge status: Home / Self Care | Payer: 59 | Source: Ambulatory Visit | Attending: Family Medicine | Admitting: Family Medicine

## 2019-03-09 VITALS — BP 124/75 | HR 93 | Temp 98.0°F | Ht 67.0 in | Wt 161.0 lb

## 2019-03-09 DIAGNOSIS — I700 Atherosclerosis of aorta: Secondary | ICD-10-CM

## 2019-03-09 DIAGNOSIS — Z23 Encounter for immunization: Secondary | ICD-10-CM

## 2019-03-09 DIAGNOSIS — Z01419 Encounter for gynecological examination (general) (routine) without abnormal findings: Secondary | ICD-10-CM

## 2019-03-09 DIAGNOSIS — Z87891 Personal history of nicotine dependence: Secondary | ICD-10-CM

## 2019-03-09 DIAGNOSIS — Z124 Encounter for screening for malignant neoplasm of cervix: Secondary | ICD-10-CM | POA: Insufficient documentation

## 2019-03-09 NOTE — Progress Notes (Signed)
Patient: Shelly Hicks, Female    DOB: [birth year], 59 y.o.   MRN: [id number] Visit Date: 03/09/2019  Today's Provider: Lelon Huh, MD   Chief Complaint  Patient presents with   Annual Exam   Subjective:     Annual physical exam Shelly Hicks is a 58 y.o. female who presents today for health maintenance and complete physical. She feels well. She reports exercising regularly. She reports she is sleeping well.  -----------------------------------------------------------------   Review of Systems  Constitutional: Negative for chills, diaphoresis and fever.  HENT: Negative for congestion, ear discharge, ear pain, hearing loss, nosebleeds, sore throat and tinnitus.   Eyes: Negative for photophobia, pain, discharge and redness.  Respiratory: Negative for cough, shortness of breath, wheezing and stridor.   Cardiovascular: Negative for chest pain, palpitations and leg swelling.  Gastrointestinal: Negative for abdominal pain, blood in stool, constipation, diarrhea, nausea and vomiting.  Endocrine: Negative for polydipsia.  Genitourinary: Negative for dysuria, flank pain, frequency, hematuria and urgency.  Musculoskeletal: Negative for back pain, myalgias and neck pain.  Skin: Negative for rash.  Allergic/Immunologic: Negative for environmental allergies.  Neurological: Negative for dizziness, tremors, seizures, weakness and headaches.  Hematological: Does not bruise/bleed easily.  Psychiatric/Behavioral: Negative for hallucinations and suicidal ideas. The patient is not nervous/anxious.     Social History      She  reports that she quit smoking about 9 years ago. Her smoking use included cigarettes. She has a 30.00 pack-year smoking history. She has never used smokeless tobacco. She reports current alcohol use. She reports that she does not use drugs.       Social History   Socioeconomic History   Marital status: Married    Spouse name: Not on file   Number of  children: 2   Years of education: Not on file   Highest education level: Not on file  Occupational History   Not on file  Social Needs   Financial resource strain: Not on file   Food insecurity    Worry: Not on file    Inability: Not on file   Transportation needs    Medical: Not on file    Non-medical: Not on file  Tobacco Use   Smoking status: Former Smoker    Packs/day: 1.00    Years: 30.00    Pack years: 30.00    Types: Cigarettes    Quit date: 08/09/2009    Years since quitting: 9.5   Smokeless tobacco: Never Used  Substance and Sexual Activity   Alcohol use: Yes    Alcohol/week: 0.0 standard drinks    Comment: occasional use   Drug use: No   Sexual activity: Not on file  Lifestyle   Physical activity    Days per week: Not on file    Minutes per session: Not on file   Stress: Not on file  Relationships   Social connections    Talks on phone: Not on file    Gets together: Not on file    Attends religious service: Not on file    Active member of club or organization: Not on file    Attends meetings of clubs or organizations: Not on file    Relationship status: Not on file  Other Topics Concern   Not on file  Social History Narrative   Not on file    Past Medical History:  Diagnosis Date   Diverticulosis    History of chicken pox    History  of measles    History of mumps    History of rib fracture      Patient Active Problem List   Diagnosis Date Noted   Aortic atherosclerosis ([place]) 01/21/2017   Personal history of tobacco use, presenting hazards to health 01/21/2017   Diverticulosis of colon 08/21/2015   History of smoking 30 or more pack years 08/21/2015   History of rib fracture 08/21/2015   Mild obesity 08/21/2015   Radiculopathy of cervical region 08/21/2015    Past Surgical History:  Procedure Laterality Date   SHOULDER SURGERY Right 04/2016   TUBAL LIGATION  2000    Family History        Family Status    Relation Name Status   Mother  Deceased   Father  Deceased at age 68   Brother half brother Alive   Neg Hx  (Not Specified)        Her family history includes Hypertension in her mother. There is no history of Heart disease, Breast cancer, or Colon cancer.      No Known Allergies  No current outpatient medications on file.   Patient Care Team: Birdie Sons, MD as PCP - General (Family Medicine) Birdie Sons, MD as Referring Physician (Family Medicine) System, Provider Not In    Objective:    Vitals: BP 124/75 (BP Location: Right Arm, Patient Position: Sitting, Cuff Size: Normal)    Pulse 93    Temp 98 F (36.7 C) (Oral)    Ht 5\' 7"  (1.702 m)    Wt 161 lb (73 kg)    LMP 08/20/2010 (Approximate)    BMI 25.22 kg/m    Vitals:   03/09/19 1506  BP: 124/75  Pulse: 93  Temp: 98 F (36.7 C)  TempSrc: Oral  Weight: 161 lb (73 kg)  Height: 5\' 7"  (1.702 m)     Physical Exam   General Appearance:    Alert, cooperative, no distress, appears stated age  Head:    Normocephalic, without obvious abnormality, atraumatic  Eyes:    PERRL, conjunctiva/corneas clear, EOM's intact, fundi    benign, both eyes  Ears:    Normal TM's and external ear canals, both ears  Nose:   Nares normal, septum midline, mucosa normal, no drainage    or sinus tenderness  Throat:   Lips, mucosa, and tongue normal; teeth and gums normal  Neck:   Supple, symmetrical, trachea midline, no adenopathy;    thyroid:  no enlargement/tenderness/nodules; no carotid   bruit or JVD  Back:     Symmetric, no curvature, ROM normal, no CVA tenderness  Lungs:     Clear to auscultation bilaterally, respirations unlabored  Chest Wall:    No tenderness or deformity   Heart:    Normal heart rate. Normal rhythm. No murmurs, rubs, or gallops.   Breast Exam:    normal appearance, no masses or tenderness  Abdomen:     Soft, non-tender, bowel sounds active all four quadrants,    no masses, no organomegaly  Pelvic:     cervix normal in appearance, external genitalia normal and vagina normal without discharge  Extremities:   Extremities normal, atraumatic, no cyanosis or edema  Pulses:   2+ and symmetric all extremities  Skin:   Skin color, texture, turgor normal, no rashes or lesions  Lymph nodes:   Cervical, supraclavicular, and axillary nodes normal  Neurologic:   CNII-XII intact, normal strength, sensation and reflexes    throughout    Depression  Screen PHQ 2/9 Scores 12/27/2016  PHQ - 2 Score 0  PHQ- 9 Score 0       Assessment & Plan:     Routine Health Maintenance and Physical Exam  Exercise Activities and Dietary recommendations Goals   None     Immunization History  Administered Date(s) Administered   Influenza-Unspecified 05/30/2015   Td 12/27/2016   Tdap 04/27/2006    Health Maintenance  Topic Date Due   HIV Screening  09/03/1974   MAMMOGRAM  04/01/2018   PAP SMEAR-Modifier  01/03/2019   INFLUENZA VACCINE  03/10/2019   COLONOSCOPY  10/11/2020   TETANUS/TDAP  12/28/2026   Hepatitis C Screening  Completed     Discussed health benefits of physical activity, and encouraged her to engage in regular exercise appropriate for her age and condition.    --------------------------------------------------------------------  1. Well woman exam with routine gynecological exam Doing well, unremarkable exam.  - Comprehensive metabolic panel - Lipid panel - CBC  2. Cervical cancer screening  - Cytology - PAP  3. Need for shingles vaccine Counseled regarding vaccine, she is going to think about and check with insurance.   4. Aortic atherosclerosis ([place]) Is former smoker, but quit 8 years ago. No other risk factors. Discussed importance of healthy lifestyle and meeting BP and lipid goals.   5. History of smoking 30 or more pack years Continue annual LDCT.     Lelon Huh, MD  [facility]

## 2019-03-09 NOTE — Patient Instructions (Addendum)
.   Please review the attached list of medications and notify my office if there are any errors.   . Please bring all of your medications to every appointment so we can make sure that our medication list is the same as yours.   . We will have flu vaccines available after Labor Day. Please go to your pharmacy or call the office in early September to schedule you flu shot.   The CDC recommends two doses of Shingrix (the shingles vaccine) separated by 2 to 6 months for adults age 50 years and older. I recommend checking with your insurance plan regarding coverage for this vaccine.      

## 2019-03-13 ENCOUNTER — Telehealth: Payer: Self-pay

## 2019-03-13 LAB — COMPREHENSIVE METABOLIC PANEL
ALT: 16 IU/L (ref 0–32)
AST: 13 IU/L (ref 0–40)
Albumin/Globulin Ratio: 1.6 (ref 1.2–2.2)
Albumin: 4.2 g/dL (ref 3.8–4.9)
Alkaline Phosphatase: 79 IU/L (ref 39–117)
BUN/Creatinine Ratio: 27 — ABNORMAL HIGH (ref 9–23)
BUN: 20 mg/dL (ref 6–24)
Bilirubin Total: 0.4 mg/dL (ref 0.0–1.2)
CO2: 22 mmol/L (ref 20–29)
Calcium: 9.4 mg/dL (ref 8.7–10.2)
Chloride: 105 mmol/L (ref 96–106)
Creatinine, Ser: 0.74 mg/dL (ref 0.57–1.00)
GFR calc Af Amer: 103 mL/min/{1.73_m2} (ref >59)
GFR calc non Af Amer: 89 mL/min/{1.73_m2} (ref >59)
Globulin, Total: 2.7 g/dL (ref 1.5–4.5)
Glucose: 107 mg/dL — ABNORMAL HIGH (ref 65–99)
Potassium: 4.1 mmol/L (ref 3.5–5.2)
Sodium: 145 mmol/L — ABNORMAL HIGH (ref 134–144)
Total Protein: 6.9 g/dL (ref 6.0–8.5)

## 2019-03-13 LAB — LIPID PANEL
Chol/HDL Ratio: 4.5 ratio — ABNORMAL HIGH (ref 0.0–4.4)
Cholesterol, Total: 222 mg/dL — ABNORMAL HIGH (ref 100–199)
HDL: 49 mg/dL (ref >39)
LDL Calculated: 141 mg/dL — ABNORMAL HIGH (ref 0–99)
Triglycerides: 158 mg/dL — ABNORMAL HIGH (ref 0–149)
VLDL Cholesterol Cal: 32 mg/dL (ref 5–40)

## 2019-03-13 LAB — CBC
Hematocrit: 40.2 % (ref 34.0–46.6)
Hemoglobin: 13.3 g/dL (ref 11.1–15.9)
MCH: 30.9 pg (ref 26.6–33.0)
MCHC: 33.1 g/dL (ref 31.5–35.7)
MCV: 94 fL (ref 79–97)
Platelets: 231 10*3/uL (ref 150–450)
RBC: 4.3 x10E6/uL (ref 3.77–5.28)
RDW: 12.4 % (ref 11.7–15.4)
WBC: 4.7 10*3/uL (ref 3.4–10.8)

## 2019-03-13 NOTE — Telephone Encounter (Signed)
-----   Message from Birdie Sons, MD sent at 03/13/2019  9:52 AM EDT ----- LDL cholesterol is up to 141, should be under 100. Considering atherosclerosis of arteries seen on CT scan, she needs to start cholesterol to prevent heart and vascular disease. Start atorvastatin 20mg  once a day, #30, rf x  3 and follow up in 3-4 months. Call if any problems filling prescription or with medication.

## 2019-03-13 NOTE — Telephone Encounter (Signed)
Pt advised.  She refuses to start Atorvastatin at this time.  She states she is going to work on diet and exercise before starting a cholesterol medication.     Thanks,   -Mickel Baas

## 2019-03-14 LAB — CYTOLOGY - PAP
Diagnosis: NEGATIVE
HPV: NOT DETECTED

## 2019-03-15 ENCOUNTER — Telehealth: Payer: Self-pay

## 2019-03-15 NOTE — Telephone Encounter (Signed)
-----   Message from Birdie Sons, MD sent at 03/15/2019  9:07 AM EDT ----- Pap normal. hpv negative. Repeat in 5 years.

## 2019-03-15 NOTE — Telephone Encounter (Signed)
Patient notified of pap results.

## 2019-03-21 ENCOUNTER — Telehealth: Payer: Self-pay | Admitting: *Deleted

## 2019-03-21 NOTE — Telephone Encounter (Signed)
Left message for patient to notify them that it is time to schedule annual low dose lung cancer screening CT scan. Instructed patient to call back to verify information prior to the scan being scheduled.  

## 2019-04-19 ENCOUNTER — Telehealth: Payer: Self-pay | Admitting: Family Medicine

## 2019-04-19 NOTE — Telephone Encounter (Signed)
Pt needing to know if her mammogram order has been placed for her to get her mammogram?  Also needing to know the name of shingles shot to ask her insurance if they will cover the shot.  Please advise.  Thanks, [employer]

## 2019-04-20 ENCOUNTER — Telehealth: Payer: Self-pay | Admitting: *Deleted

## 2019-04-20 DIAGNOSIS — Z122 Encounter for screening for malignant neoplasm of respiratory organs: Secondary | ICD-10-CM

## 2019-04-20 DIAGNOSIS — Z87891 Personal history of nicotine dependence: Secondary | ICD-10-CM

## 2019-04-20 NOTE — Telephone Encounter (Signed)
Patient mammogram appointment scheduled at [facility] on 05/01/2019 at 11:35am. Patient advised.

## 2019-04-20 NOTE — Telephone Encounter (Signed)
Patient has been notified that annual lung cancer screening low dose CT scan is due currently or will be in near future. Confirmed that patient is within the age range of 55-77, and asymptomatic, (no signs or symptoms of lung cancer). Patient denies illness that would prevent curative treatment for lung cancer if found. Verified smoking history, (former, quit 08/09/09, 30 pack year). The shared decision making visit was done 01/19/17. Patient is agreeable for CT scan being scheduled.

## 2019-04-30 ENCOUNTER — Ambulatory Visit
Admission: RE | Admit: 2019-04-30 | Discharge: 2019-04-30 | Disposition: A | Discharge status: Home / Self Care | Payer: 59 | Source: Ambulatory Visit | Attending: Nurse Practitioner | Admitting: Nurse Practitioner

## 2019-04-30 ENCOUNTER — Other Ambulatory Visit: Payer: Self-pay

## 2019-04-30 DIAGNOSIS — Z122 Encounter for screening for malignant neoplasm of respiratory organs: Secondary | ICD-10-CM | POA: Diagnosis present

## 2019-04-30 DIAGNOSIS — Z87891 Personal history of nicotine dependence: Secondary | ICD-10-CM | POA: Insufficient documentation

## 2019-05-02 ENCOUNTER — Encounter: Payer: Self-pay | Admitting: *Deleted

## 2019-05-24 LAB — HM MAMMOGRAPHY

## 2020-04-12 ENCOUNTER — Telehealth: Payer: Self-pay | Admitting: *Deleted

## 2020-04-12 NOTE — Telephone Encounter (Signed)
Pt notified that lung cancer screening imaging is due currently or in the near future. Patient is a former smoker. She has not smoked cigarettes for 6 years. Appointment scheduled on 05/15/2020 at 1:15pm.

## 2020-04-22 ENCOUNTER — Other Ambulatory Visit: Payer: Self-pay | Admitting: *Deleted

## 2020-04-22 DIAGNOSIS — Z122 Encounter for screening for malignant neoplasm of respiratory organs: Secondary | ICD-10-CM

## 2020-04-22 DIAGNOSIS — Z87891 Personal history of nicotine dependence: Secondary | ICD-10-CM

## 2020-04-22 NOTE — Progress Notes (Signed)
Former smoker, quit 08/09/09, 30 pack year

## 2020-05-15 ENCOUNTER — Ambulatory Visit: Admission: RE | Admit: 2020-05-15 | Payer: 59 | Source: Ambulatory Visit

## 2020-05-15 ENCOUNTER — Telehealth: Payer: Self-pay | Admitting: *Deleted

## 2020-05-15 NOTE — Telephone Encounter (Signed)
Patient request to reschedule lung screening scan. appt given for 05/23/20 at 1pm

## 2020-05-23 ENCOUNTER — Ambulatory Visit
Admission: RE | Admit: 2020-05-23 | Discharge: 2020-05-23 | Disposition: A | Discharge status: Home / Self Care | Payer: 59 | Source: Ambulatory Visit | Attending: Nurse Practitioner | Admitting: Nurse Practitioner

## 2020-05-23 ENCOUNTER — Other Ambulatory Visit: Payer: Self-pay

## 2020-05-23 DIAGNOSIS — Z87891 Personal history of nicotine dependence: Secondary | ICD-10-CM | POA: Diagnosis not present

## 2020-05-23 DIAGNOSIS — Z122 Encounter for screening for malignant neoplasm of respiratory organs: Secondary | ICD-10-CM | POA: Diagnosis present

## 2020-05-28 ENCOUNTER — Encounter: Payer: Self-pay | Admitting: *Deleted

## 2020-06-01 ENCOUNTER — Encounter: Payer: Self-pay | Admitting: Family Medicine

## 2020-06-01 DIAGNOSIS — J439 Emphysema, unspecified: Secondary | ICD-10-CM | POA: Insufficient documentation

## 2020-08-25 ENCOUNTER — Telehealth: Payer: Self-pay

## 2020-08-25 NOTE — Telephone Encounter (Signed)
If she has not symptoms then she should wait at least 5 days after his test was positive, otherwise high chance of a false negative.

## 2020-08-25 NOTE — Telephone Encounter (Signed)
Copied from [place] [id number]. Topic: General - Other >> Aug 25, 2020  7:50 AM Hinda Lenis D wrote: PT need a covid test / husband positive for covid / please advise

## 2020-08-26 ENCOUNTER — Other Ambulatory Visit: Payer: 59

## 2020-08-26 NOTE — Telephone Encounter (Signed)
I called and spoke with patient. She is having symptoms. She has had cough, runny nose and PND for the past 5 days. Symptoms started 08/21/2020. She says it feels like a sinus infection. Patient is schedule to have a COVID test tomorrow at 5:45pm with [facility]. Patient has been taking mucinex which she reports has helped improve symptoms.

## 2020-08-27 ENCOUNTER — Other Ambulatory Visit: Payer: 59

## 2020-08-27 ENCOUNTER — Other Ambulatory Visit: Payer: Self-pay

## 2020-08-27 DIAGNOSIS — Z20822 Contact with and (suspected) exposure to covid-19: Secondary | ICD-10-CM

## 2020-08-29 ENCOUNTER — Telehealth: Payer: Self-pay | Admitting: Family Medicine

## 2020-08-29 ENCOUNTER — Encounter: Payer: Self-pay | Admitting: Family Medicine

## 2020-08-29 LAB — SARS-COV-2, NAA 2 DAY TAT

## 2020-08-29 LAB — NOVEL CORONAVIRUS, NAA: SARS-CoV-2, NAA: DETECTED — AB

## 2020-08-29 NOTE — Telephone Encounter (Signed)
Spoke with patient- she states her symptoms started last Thursday( 1/13) and were mild sinus symptoms. The bad weather delayed her testing. She is passed her isolation and is wearing mask now- she is required to have negative test or doctors note stating she can return to work. Patient states they wear mask at work anyway- she is requesting doctor's note back to work. She states is can be sent to her Email- or MyChart-.

## 2020-08-29 NOTE — Telephone Encounter (Signed)
Patient was informed through [facility] My Chart COVID was "detected, patient unsure what that meant therefore transferred her to [facility] Nurse Triage.    Patient is requesting a return back to work on 09/01/2020 Dr. Radene Ou. Patient states she is asymptomatic.Please advise patient directly

## 2020-09-03 NOTE — Telephone Encounter (Signed)
I called and spoke with patient. She says someone from our office already took care of the work note for her.

## 2020-09-03 NOTE — Telephone Encounter (Signed)
Does she still need this note. I was out of the office when these messages were sent and am just now getting to them. Note says she returned to work on the 24th.

## 2021-01-27 ENCOUNTER — Ambulatory Visit: Payer: 59 | Admitting: Family Medicine

## 2021-03-13 ENCOUNTER — Ambulatory Visit (INDEPENDENT_AMBULATORY_CARE_PROVIDER_SITE_OTHER): Payer: 59 | Admitting: Family Medicine

## 2021-03-13 ENCOUNTER — Other Ambulatory Visit: Payer: Self-pay

## 2021-03-13 VITALS — BP 132/65 | HR 70 | Temp 97.8°F | Resp 16 | Ht 67.0 in | Wt 151.0 lb

## 2021-03-13 DIAGNOSIS — L989 Disorder of the skin and subcutaneous tissue, unspecified: Secondary | ICD-10-CM

## 2021-03-13 DIAGNOSIS — E669 Obesity, unspecified: Secondary | ICD-10-CM

## 2021-03-13 DIAGNOSIS — I700 Atherosclerosis of aorta: Secondary | ICD-10-CM | POA: Diagnosis not present

## 2021-03-13 DIAGNOSIS — Z1211 Encounter for screening for malignant neoplasm of colon: Secondary | ICD-10-CM

## 2021-03-13 DIAGNOSIS — Z1239 Encounter for other screening for malignant neoplasm of breast: Secondary | ICD-10-CM

## 2021-03-13 DIAGNOSIS — Z0000 Encounter for general adult medical examination without abnormal findings: Secondary | ICD-10-CM

## 2021-03-13 DIAGNOSIS — J439 Emphysema, unspecified: Secondary | ICD-10-CM

## 2021-03-13 NOTE — Patient Instructions (Signed)
Please review the attached list of medications and notify my office if there are any errors.   Please bring all of your medications to every appointment so we can make sure that our medication list is the same as yours.  Please call the [facility] at [facility] at [phone] to schedule your mammogram.

## 2021-03-13 NOTE — Progress Notes (Signed)
Complete physical exam   Patient: Shelly Hicks   DOB: 02/12/1961   60 y.o. Female  MRN: [id number] Visit Date: 03/13/2021  Today's healthcare provider: Lelon Huh, MD   Chief Complaint  Patient presents with   Annual Exam    Subjective    Shelly Hicks is a 60 y.o. female who presents today for a complete physical exam.  She reports consuming a low fat diet. Home exercise routine includes walking. She generally feels well. She reports sleeping well.   Past Medical History:  Diagnosis Date   Diverticulosis    History of chicken pox    History of measles    History of mumps    History of rib fracture    Past Surgical History:  Procedure Laterality Date   SHOULDER SURGERY Right 04/2016   TUBAL LIGATION  2000   Social History   Socioeconomic History   Marital status: Married    Spouse name: Not on file   Number of children: 2   Years of education: Not on file   Highest education level: Not on file  Occupational History   Not on file  Tobacco Use   Smoking status: Former    Packs/day: 1.00    Years: 30.00    Pack years: 30.00    Types: Cigarettes    Quit date: 08/09/2009    Years since quitting: 11.6   Smokeless tobacco: Never  Vaping Use   Vaping Use: Some days  Substance and Sexual Activity   Alcohol use: Yes    Alcohol/week: 0.0 standard drinks    Comment: occasional use   Drug use: No   Sexual activity: Not on file  Other Topics Concern   Not on file  Social History Narrative   Not on file   Social Determinants of Health   Financial Resource Strain: Not on file  Food Insecurity: Not on file  Transportation Needs: Not on file  Physical Activity: Not on file  Stress: Not on file  Social Connections: Not on file  Intimate Partner Violence: Not on file   Family Status  Relation Name Status   Mother  Deceased   Father  Deceased at age 73   Brother half brother Alive   Neg Hx  (Not Specified)   Family History  Problem Relation  Age of Onset   Hypertension Mother    Heart disease Neg Hx    Breast cancer Neg Hx    Colon cancer Neg Hx    No Known Allergies  Patient Care Team: Birdie Sons, MD as PCP - General (Family Medicine) Caryn Section Kirstie Peri, MD as Referring Physician (Family Medicine) System, Provider Not In   Medications: No outpatient medications prior to visit.   No facility-administered medications prior to visit.    Review of Systems  All other systems reviewed and are negative.    Objective    BP 132/65   Pulse 70   Temp 97.8 F (36.6 C)   Resp 16   Ht '5\' 7"'$  (1.702 m)   Wt 151 lb (68.5 kg)   LMP 08/20/2010 (Approximate)   BMI 23.65 kg/m    Physical Exam   General Appearance:    Well developed, well nourished female. Alert, cooperative, in no acute distress, appears stated age   Head:    Normocephalic, without obvious abnormality, atraumatic  Eyes:    PERRL, conjunctiva/corneas clear, EOM's intact, fundi    benign, both eyes  Ears:  Normal TM's and external ear canals, both ears  Neck:   Supple, symmetrical, trachea midline, no adenopathy;    thyroid:  no enlargement/tenderness/nodules; no carotid   bruit or JVD  Back:     Symmetric, no curvature, ROM normal, no CVA tenderness  Lungs:     Clear to auscultation bilaterally, respirations unlabored  Chest Wall:    No tenderness or deformity   Heart:    Normal heart rate. Normal rhythm. No murmurs, rubs, or gallops.   Breast Exam:    normal appearance, no masses or tenderness, deferred  Abdomen:     Soft, non-tender, bowel sounds active all four quadrants,    no masses, no organomegaly  Pelvic:    deferred  Extremities:   All extremities are intact. No cyanosis or edema  Pulses:   2+ and symmetric all extremities  Skin:   Skin color, texture, turgor normal. About 1cm round ping lesion with central scab left upper arm at site of her second covid vaccine which she was given well over 1 year ago.   Lymph nodes:   Cervical,  supraclavicular, and axillary nodes normal  Neurologic:   CNII-XII intact, normal strength, sensation and reflexes    throughout     Last depression screening scores PHQ 2/9 Scores 03/13/2021 12/27/2016  PHQ - 2 Score 0 0  PHQ- 9 Score - 0   Last fall risk screening Fall Risk  03/13/2021  Falls in the past year? 0  Number falls in past yr: 0  Injury with Fall? 0  Risk for fall due to : No Fall Risks  Follow up Falls evaluation completed   Last Audit-C alcohol use screening Alcohol Use Disorder Test (AUDIT) 03/13/2021  1. How often do you have a drink containing alcohol? 2  2. How many drinks containing alcohol do you have on a typical day when you are drinking? 0  3. How often do you have six or more drinks on one occasion? 0  AUDIT-C Score 2   A score of 3 or more in women, and 4 or more in men indicates increased risk for alcohol abuse, EXCEPT if all of the points are from question 1   No results found for any visits on 03/13/21.  Assessment & Plan    Routine Health Maintenance and Physical Exam  Exercise Activities and Dietary recommendations  Goals   None     Immunization History  Administered Date(s) Administered   Influenza-Unspecified 05/30/2015   Td 12/27/2016   Tdap 04/27/2006    Health Maintenance  Topic Date Due   COVID-19 Vaccine (1) Never done   HIV Screening  Never done   Zoster Vaccines- Shingrix (1 of 2) Never done   MAMMOGRAM  05/23/2020   COLONOSCOPY (Pts 45-36yr Insurance coverage will need to be confirmed)  10/11/2020   INFLUENZA VACCINE  03/09/2021   PAP SMEAR-Modifier  03/08/2024   TETANUS/TDAP  12/28/2026   Hepatitis C Screening  Completed   Pneumococcal Vaccine 027637Years old  Aged Out   HPV VACCINES  Aged Out    Discussed health benefits of physical activity, and encouraged her to engage in regular exercise appropriate for her age and condition.  1. Annual physical exam Generally doing well - CBC - Lipid panel - Comprehensive  metabolic panel  She declined Shingrix vaccine.   2. Pulmonary emphysema, unspecified emphysema type ([employer] Incidental finding on LDCT. Quit smoking over 10 years ago and is doing well.   3. Aortic atherosclerosis ([place]  Incidental finding on LDCT. Asymptomatic. Has not smoked in over 10 years.   4. Mild obesity She reports she has been intentionally losing weight over the last few years with diet and exercise and is noted to have lost nearly 20 pounds since 2020.   5. Colon cancer screening  - Ambulatory referral to Gastroenterology  6. Encounter for screening for malignant neoplasm of breast, unspecified screening modality  - MM Digital Screening; Future   7. Skin lesion left arm  Scabby lesion appeared immediately after her second Covid vaccine well over a year ago and is not healing, which she is concerned about. Referral to dermatology placed.     The entirety of the information documented in the History of Present Illness, Review of Systems and Physical Exam were personally obtained by me. Portions of this information were initially documented by the CMA and reviewed by me for thoroughness and accuracy.     Lelon Huh, MD  [facility] [phone] (phone) [phone] (fax)  [place]

## 2021-03-18 ENCOUNTER — Ambulatory Visit: Payer: 59 | Admitting: Dermatology

## 2021-03-18 ENCOUNTER — Other Ambulatory Visit: Payer: Self-pay

## 2021-03-18 DIAGNOSIS — C4491 Basal cell carcinoma of skin, unspecified: Secondary | ICD-10-CM

## 2021-03-18 DIAGNOSIS — C44619 Basal cell carcinoma of skin of left upper limb, including shoulder: Secondary | ICD-10-CM

## 2021-03-18 DIAGNOSIS — D492 Neoplasm of unspecified behavior of bone, soft tissue, and skin: Secondary | ICD-10-CM

## 2021-03-18 HISTORY — DX: Basal cell carcinoma of skin, unspecified: C44.91

## 2021-03-18 NOTE — Progress Notes (Signed)
   New Patient Visit  Subjective  Shelly Hicks is a 60 y.o. female who presents for the following: Skin Problem (Pt c/o skin rash on the left upper arm x 1 year appeared after pt had a Covid 19 injection ). Treating with Neosporin with a poor response.   The following portions of the chart were reviewed this encounter and updated as appropriate:   Tobacco  Allergies  Meds  Problems  Med Hx  Surg Hx  Fam Hx     Review of Systems:  No other skin or systemic complaints except as noted in HPI or Assessment and Plan.  Objective  Well appearing patient in no apparent distress; mood and affect are within normal limits.  A focused examination was performed including left upper arm. Relevant physical exam findings are noted in the Assessment and Plan.  left deltoid 1.2 cm Hyperkeratotic pink plaque         Assessment & Plan  Neoplasm of skin left deltoid  Epidermal / dermal shaving  Lesion diameter (cm):  1.2 Informed consent: discussed and consent obtained   Timeout: patient name, date of birth, surgical site, and procedure verified   Patient was prepped and draped in usual sterile fashion: area prepped with alcohol. Anesthesia: the lesion was anesthetized in a standard fashion   Anesthetic:  1% lidocaine w/ epinephrine 1-100,000 local infiltration Instrument used: flexible razor blade   Hemostasis achieved with: pressure, aluminum chloride and electrodesiccation   Outcome: patient tolerated procedure well   Post-procedure details: wound care instructions given   Post-procedure details comment:  Ointment and a small bandage applied  Specimen 1 - Surgical pathology Differential Diagnosis: R/O AK vs ISK vs skin cancer   Check Margins: No  Return if symptoms worsen or fail to improve.  IMarye Round, CMA, am acting as scribe for Sarina Ser, MD .  Documentation: I have reviewed the above documentation for accuracy and completeness, and I agree with the  above.  Sarina Ser, MD

## 2021-03-18 NOTE — Patient Instructions (Addendum)
Wound Care Instructions  Cleanse wound gently with soap and water once a day then pat dry with clean gauze. Apply a thing coat of Petrolatum (petroleum jelly, "Vaseline") over the wound (unless you have an allergy to this). We recommend that you use a new, sterile tube of Vaseline. Do not pick or remove scabs. Do not remove the yellow or white "healing tissue" from the base of the wound.  Cover the wound with fresh, clean, nonstick gauze and secure with paper tape. You may use Band-Aids in place of gauze and tape if the would is small enough, but would recommend trimming much of the tape off as there is often too much. Sometimes Band-Aids can irritate the skin.  You should call the office for your biopsy report after 1 week if you have not already been contacted.  If you experience any problems, such as abnormal amounts of bleeding, swelling, significant bruising, significant pain, or evidence of infection, please call the office immediately.  FOR ADULT SURGERY PATIENTS: If you need something for pain relief you may take 1 extra strength Tylenol (acetaminophen) AND 2 Ibuprofen (200mg each) together every 4 hours as needed for pain. (do not take these if you are allergic to them or if you have a reason you should not take them.) Typically, you may only need pain medication for 1 to 3 days.   If you have any questions or concerns for your doctor, please call our main line at [phone] and press option 4 to reach your doctor's medical assistant. If no one answers, please leave a voicemail as directed and we will return your call as soon as possible. Messages left after 4 pm will be answered the following business day.   You may also send us a message via MyChart. We typically respond to MyChart messages within 1-2 business days.  For prescription refills, please ask your pharmacy to contact our office. Our fax number is [phone].  If you have an urgent issue when the clinic is closed that  cannot wait until the next business day, you can page your doctor at the number below.    Please note that while we do our best to be available for urgent issues outside of office hours, we are not available 24/7.   If you have an urgent issue and are unable to reach us, you may choose to seek medical care at your doctor's office, retail clinic, urgent care center, or emergency room.  If you have a medical emergency, please immediately call 911 or go to the emergency department.  Pager Numbers  - Dr. Kowalski: [phone]  - Dr. Moye: [phone]  - Dr. Stewart: [phone]  In the event of inclement weather, please call our main line at [phone] for an update on the status of any delays or closures.  Dermatology Medication Tips: Please keep the boxes that topical medications come in in order to help keep track of the instructions about where and how to use these. Pharmacies typically print the medication instructions only on the boxes and not directly on the medication tubes.   If your medication is too expensive, please contact our office at [phone] option 4 or send us a message through MyChart.   We are unable to tell what your co-pay for medications will be in advance as this is different depending on your insurance coverage. However, we may be able to find a substitute medication at lower cost or fill out paperwork to get insurance to cover a needed   medication.   If a prior authorization is required to get your medication covered by your insurance company, please allow us 1-2 business days to complete this process.  Drug prices often vary depending on where the prescription is filled and some pharmacies may offer cheaper prices.  The website www.goodrx.com contains coupons for medications through different pharmacies. The prices here do not account for what the cost may be with help from insurance (it may be cheaper with your insurance), but the website can give you the  price if you did not use any insurance.  - You can print the associated coupon and take it with your prescription to the pharmacy.  - You may also stop by our office during regular business hours and pick up a GoodRx coupon card.  - If you need your prescription sent electronically to a different pharmacy, notify our office through [place] MyChart or by phone at [phone] option 4.   

## 2021-03-24 ENCOUNTER — Encounter: Payer: Self-pay | Admitting: Dermatology

## 2021-03-24 ENCOUNTER — Telehealth: Payer: Self-pay

## 2021-03-24 NOTE — Telephone Encounter (Signed)
LM on VM please return my call  

## 2021-03-24 NOTE — Telephone Encounter (Signed)
Patient advised of BX results and scheduled for EDC.  °

## 2021-03-24 NOTE — Telephone Encounter (Signed)
-----   Message from Ralene Bathe, MD sent at 03/24/2021  9:42 AM EDT ----- Diagnosis Skin , left deltoid BASAL CELL CARCINOMA, NODULAR PATTERN, BASE INVOLVED  Cancer - BCC Schedule for treatment (EDC)

## 2021-03-25 LAB — CBC
Hematocrit: 40.6 % (ref 34.0–46.6)
Hemoglobin: 12.7 g/dL (ref 11.1–15.9)
MCH: 31.4 pg (ref 26.6–33.0)
MCHC: 31.3 g/dL — ABNORMAL LOW (ref 31.5–35.7)
MCV: 100 fL — ABNORMAL HIGH (ref 79–97)
Platelets: 249 10*3/uL (ref 150–450)
RBC: 4.05 x10E6/uL (ref 3.77–5.28)
RDW: 12.6 % (ref 11.7–15.4)
WBC: 5.8 10*3/uL (ref 3.4–10.8)

## 2021-03-25 LAB — LIPID PANEL
Chol/HDL Ratio: 4.6 ratio — ABNORMAL HIGH (ref 0.0–4.4)
Cholesterol, Total: 189 mg/dL (ref 100–199)
HDL: 41 mg/dL (ref >39)
LDL Chol Calc (NIH): 131 mg/dL — ABNORMAL HIGH (ref 0–99)
Triglycerides: 95 mg/dL (ref 0–149)
VLDL Cholesterol Cal: 17 mg/dL (ref 5–40)

## 2021-03-25 LAB — COMPREHENSIVE METABOLIC PANEL
ALT: 13 IU/L (ref 0–32)
AST: 13 IU/L (ref 0–40)
Albumin/Globulin Ratio: 1.4 (ref 1.2–2.2)
Albumin: 4.2 g/dL (ref 3.8–4.8)
Alkaline Phosphatase: 86 IU/L (ref 44–121)
BUN/Creatinine Ratio: 35 — ABNORMAL HIGH (ref 12–28)
BUN: 22 mg/dL (ref 8–27)
Bilirubin Total: 0.2 mg/dL (ref 0.0–1.2)
CO2: 24 mmol/L (ref 20–29)
Calcium: 9.8 mg/dL (ref 8.7–10.3)
Chloride: 99 mmol/L (ref 96–106)
Creatinine, Ser: 0.62 mg/dL (ref 0.57–1.00)
Globulin, Total: 2.9 g/dL (ref 1.5–4.5)
Glucose: 99 mg/dL (ref 65–99)
Potassium: 4.2 mmol/L (ref 3.5–5.2)
Sodium: 138 mmol/L (ref 134–144)
Total Protein: 7.1 g/dL (ref 6.0–8.5)
eGFR: 101 mL/min/{1.73_m2} (ref >59)

## 2021-04-20 ENCOUNTER — Other Ambulatory Visit: Payer: Self-pay

## 2021-04-20 ENCOUNTER — Ambulatory Visit: Payer: 59 | Admitting: Dermatology

## 2021-04-20 DIAGNOSIS — L578 Other skin changes due to chronic exposure to nonionizing radiation: Secondary | ICD-10-CM

## 2021-04-20 DIAGNOSIS — L821 Other seborrheic keratosis: Secondary | ICD-10-CM | POA: Diagnosis not present

## 2021-04-20 DIAGNOSIS — C44619 Basal cell carcinoma of skin of left upper limb, including shoulder: Secondary | ICD-10-CM

## 2021-04-20 DIAGNOSIS — L820 Inflamed seborrheic keratosis: Secondary | ICD-10-CM

## 2021-04-20 DIAGNOSIS — C44611 Basal cell carcinoma of skin of unspecified upper limb, including shoulder: Secondary | ICD-10-CM

## 2021-04-20 NOTE — Progress Notes (Signed)
   Follow-Up Visit   Subjective  Shelly Hicks is a 60 y.o. female who presents for the following: Procedure (Patient here today to treat biopsy proven BCC at left deltoid. ). Patient also has a spot at her right hand that has been there for a while. It is rough and patient does pick at it.   The following portions of the chart were reviewed this encounter and updated as appropriate:   Tobacco  Allergies  Meds  Problems  Med Hx  Surg Hx  Fam Hx     Review of Systems:  No other skin or systemic complaints except as noted in HPI or Assessment and Plan.  Objective  Well appearing patient in no apparent distress; mood and affect are within normal limits.  A focused examination was performed including left arm, right hand. Relevant physical exam findings are noted in the Assessment and Plan.  left deltoid Pink healing biopsy site  Right Hand Erythematous keratotic or waxy stuck-on papule or plaque.    Assessment & Plan  Basal cell carcinoma (BCC) of skin of upper extremity including shoulder, unspecified laterality left deltoid  Destruction of lesion Complexity: extensive   Destruction method: electrodesiccation and curettage   Informed consent: discussed and consent obtained   Timeout:  patient name, date of birth, surgical site, and procedure verified Procedure prep:  Patient was prepped and draped in usual sterile fashion Prep type:  Isopropyl alcohol Anesthesia: the lesion was anesthetized in a standard fashion   Anesthetic:  1% lidocaine w/ epinephrine 1-100,000 buffered w/ 8.4% NaHCO3 Curettage performed in three different directions: Yes   Electrodesiccation performed over the curetted area: Yes   Lesion length (cm):  1.2 Lesion width (cm):  1.2 Margin per side (cm):  0.2 Final wound size (cm):  1.6 Hemostasis achieved with:  pressure, aluminum chloride and electrodesiccation Outcome: patient tolerated procedure well with no complications   Post-procedure  details: sterile dressing applied and wound care instructions given   Dressing type: bandage and petrolatum    Inflamed seborrheic keratosis Right Hand  Destruction of lesion - Right Hand Complexity: simple   Destruction method: cryotherapy   Informed consent: discussed and consent obtained   Timeout:  patient name, date of birth, surgical site, and procedure verified Lesion destroyed using liquid nitrogen: Yes   Region frozen until ice ball extended beyond lesion: Yes   Outcome: patient tolerated procedure well with no complications   Post-procedure details: wound care instructions given    Seborrheic Keratoses - Stuck-on, waxy, tan-brown papules and/or plaques  - Benign-appearing - Discussed benign etiology and prognosis. - Observe - Call for any changes  Actinic Damage - chronic, secondary to cumulative UV radiation exposure/sun exposure over time - diffuse scaly erythematous macules with underlying dyspigmentation - Recommend daily broad spectrum sunscreen SPF 30+ to sun-exposed areas, reapply every 2 hours as needed.  - Recommend staying in the shade or wearing long sleeves, sun glasses (UVA+UVB protection) and wide brim hats (4-inch brim around the entire circumference of the hat). - Call for new or changing lesions.  Return for TBSE, next available.  Graciella Belton, RMA, am acting as scribe for Sarina Ser, MD . Documentation: I have reviewed the above documentation for accuracy and completeness, and I agree with the above.  Sarina Ser, MD

## 2021-04-20 NOTE — Patient Instructions (Addendum)
Cryotherapy Aftercare  Wash gently with soap and water everyday.   Apply Vaseline and Band-Aid daily until healed.   Wound Care Instructions  Cleanse wound gently with soap and water once a day then pat dry with clean gauze. Apply a thing coat of Petrolatum (petroleum jelly, "Vaseline") over the wound (unless you have an allergy to this). We recommend that you use a new, sterile tube of Vaseline. Do not pick or remove scabs. Do not remove the yellow or white "healing tissue" from the base of the wound.  Cover the wound with fresh, clean, nonstick gauze and secure with paper tape. You may use Band-Aids in place of gauze and tape if the would is small enough, but would recommend trimming much of the tape off as there is often too much. Sometimes Band-Aids can irritate the skin.  You should call the office for your biopsy report after 1 week if you have not already been contacted.  If you experience any problems, such as abnormal amounts of bleeding, swelling, significant bruising, significant pain, or evidence of infection, please call the office immediately.  FOR ADULT SURGERY PATIENTS: If you need something for pain relief you may take 1 extra strength Tylenol (acetaminophen) AND 2 Ibuprofen (200mg  each) together every 4 hours as needed for pain. (do not take these if you are allergic to them or if you have a reason you should not take them.) Typically, you may only need pain medication for 1 to 3 days.   Recommend daily broad spectrum sunscreen SPF 30+ to sun-exposed areas, reapply every 2 hours as needed. Call for new or changing lesions.  Staying in the shade or wearing long sleeves, sun glasses (UVA+UVB protection) and wide brim hats (4-inch brim around the entire circumference of the hat) are also recommended for sun protection.   If you have any questions or concerns for your doctor, please call our main line at [phone] and press option 4 to reach your doctor's medical assistant. If no  one answers, please leave a voicemail as directed and we will return your call as soon as possible. Messages left after 4 pm will be answered the following business day.   You may also send [place] a message via [place]. We typically respond to MyChart messages within 1-2 business days.  For prescription refills, please ask your pharmacy to contact our office. Our fax number is [phone].  If you have an urgent issue when the clinic is closed that cannot wait until the next business day, you can page your doctor at the number below.    Please note that while we do our best to be available for urgent issues outside of office hours, we are not available 24/7.   If you have an urgent issue and are unable to reach [place], you may choose to seek medical care at your doctor's office, retail clinic, urgent care center, or emergency room.  If you have a medical emergency, please immediately call 911 or go to the emergency department.  Pager Numbers  - Dr. Nehemiah Massed: [phone]  - Dr. Laurence Ferrari: [phone]  - Dr. Nicole Kindred: [phone]  In the event of inclement weather, please call our main line at [phone] for an update on the status of any delays or closures.  Dermatology Medication Tips: Please keep the boxes that topical medications come in in order to help keep track of the instructions about where and how to use these. Pharmacies typically print the medication instructions only on the boxes and not  directly on the medication tubes.   If your medication is too expensive, please contact our office at [phone] option 4 or send [place] a message through [place].   We are unable to tell what your co-pay for medications will be in advance as this is different depending on your insurance coverage. However, we may be able to find a substitute medication at lower cost or fill out paperwork to get insurance to cover a needed medication.   If a prior authorization is required to get your medication  covered by your insurance company, please allow [place] 1-2 business days to complete this process.  Drug prices often vary depending on where the prescription is filled and some pharmacies may offer cheaper prices.  The website www.goodrx.com contains coupons for medications through different pharmacies. The prices here do not account for what the cost may be with help from insurance (it may be cheaper with your insurance), but the website can give you the price if you did not use any insurance.  - You can print the associated coupon and take it with your prescription to the pharmacy.  - You may also stop by our office during regular business hours and pick up a GoodRx coupon card.  - If you need your prescription sent electronically to a different pharmacy, notify our office through [facility] or by phone at [phone] option 4.

## 2021-04-23 ENCOUNTER — Encounter: Payer: Self-pay | Admitting: Dermatology

## 2021-07-28 LAB — HM MAMMOGRAPHY

## 2021-08-12 ENCOUNTER — Ambulatory Visit: Payer: 59 | Admitting: Dermatology

## 2021-08-12 ENCOUNTER — Encounter: Payer: Self-pay | Admitting: Dermatology

## 2021-08-12 ENCOUNTER — Other Ambulatory Visit: Payer: Self-pay

## 2021-08-12 DIAGNOSIS — D1800 Hemangioma unspecified site: Secondary | ICD-10-CM

## 2021-08-12 DIAGNOSIS — L821 Other seborrheic keratosis: Secondary | ICD-10-CM

## 2021-08-12 DIAGNOSIS — L814 Other melanin hyperpigmentation: Secondary | ICD-10-CM

## 2021-08-12 DIAGNOSIS — L570 Actinic keratosis: Secondary | ICD-10-CM | POA: Diagnosis not present

## 2021-08-12 DIAGNOSIS — Z85828 Personal history of other malignant neoplasm of skin: Secondary | ICD-10-CM | POA: Diagnosis not present

## 2021-08-12 DIAGNOSIS — D229 Melanocytic nevi, unspecified: Secondary | ICD-10-CM

## 2021-08-12 DIAGNOSIS — L578 Other skin changes due to chronic exposure to nonionizing radiation: Secondary | ICD-10-CM | POA: Diagnosis not present

## 2021-08-12 DIAGNOSIS — Z1283 Encounter for screening for malignant neoplasm of skin: Secondary | ICD-10-CM

## 2021-08-12 NOTE — Progress Notes (Signed)
Follow-Up Visit   Subjective  Shelly Hicks is a 61 y.o. female who presents for the following: Annual Exam. Hx of BCC left deltoid 03/2021 The patient presents for Total-Body Skin Exam (TBSE) for skin cancer screening and mole check.  The patient has spots, moles and lesions to be evaluated, some may be new or changing and the patient has concerns that these could be cancer.   The following portions of the chart were reviewed this encounter and updated as appropriate:   Tobacco   Allergies   Meds   Problems   Med Hx   Surg Hx   Fam Hx      Review of Systems:  No other skin or systemic complaints except as noted in HPI or Assessment and Plan.  Objective  Well appearing patient in no apparent distress; mood and affect are within normal limits.  A full examination was performed including scalp, head, eyes, ears, nose, lips, neck, chest, axillae, abdomen, back, buttocks, bilateral upper extremities, bilateral lower extremities, hands, feet, fingers, toes, fingernails, and toenails. All findings within normal limits unless otherwise noted below.  left deltoid Well healed scar with no evidence of recurrence.   right nose, right cheek, upper lip  (6) (6) Erythematous thin papules/macules with gritty scale.    Assessment & Plan  History of basal cell carcinoma (BCC) left deltoid  Hx of BCC treated with EDC  Clear. Observe for recurrence. Call clinic for new or changing lesions.  Recommend regular skin exams, daily broad-spectrum spf 30+ sunscreen use, and photoprotection.     AK (actinic keratosis) (6) right nose, right cheek, upper lip  (6)  Actinic keratoses are precancerous spots that appear secondary to cumulative UV radiation exposure/sun exposure over time. They are chronic with expected duration over 1 year. A portion of actinic keratoses will progress to squamous cell carcinoma of the skin. It is not possible to reliably predict which spots will progress to skin cancer and so  treatment is recommended to prevent development of skin cancer.  Recommend daily broad spectrum sunscreen SPF 30+ to sun-exposed areas, reapply every 2 hours as needed.  Recommend staying in the shade or wearing long sleeves, sun glasses (UVA+UVB protection) and wide brim hats (4-inch brim around the entire circumference of the hat). Call for new or changing lesions.   Destruction of lesion - right nose, right cheek, upper lip  (6) Complexity: extensive   Destruction method: electrodesiccation and curettage   Informed consent: discussed and consent obtained   Timeout:  patient name, date of birth, surgical site, and procedure verified Procedure prep:  Patient was prepped and draped in usual sterile fashion Prep type:  Isopropyl alcohol Anesthesia: the lesion was anesthetized in a standard fashion   Anesthetic:  1% lidocaine w/ epinephrine 1-100,000 buffered w/ 8.4% NaHCO3 Curettage performed in three different directions: Yes   Electrodesiccation performed over the curetted area: Yes   Hemostasis achieved with:  pressure, aluminum chloride and electrodesiccation Outcome: patient tolerated procedure well with no complications   Post-procedure details: sterile dressing applied and wound care instructions given   Dressing type: bandage and petrolatum    Skin cancer screening  Lentigines - Scattered tan macules - Due to sun exposure - Benign-appearing, observe - Recommend daily broad spectrum sunscreen SPF 30+ to sun-exposed areas, reapply every 2 hours as needed. - Call for any changes  Seborrheic Keratoses - Stuck-on, waxy, tan-brown papules and/or plaques  - Benign-appearing - Discussed benign etiology and prognosis. - Observe -  Call for any changes  Melanocytic Nevi - Tan-brown and/or pink-flesh-colored symmetric macules and papules - Benign appearing on exam today - Observation - Call clinic for new or changing moles - Recommend daily use of broad spectrum spf 30+  sunscreen to sun-exposed areas.   Hemangiomas - Red papules - Discussed benign nature - Observe - Call for any changes  Actinic Damage - Chronic condition, secondary to cumulative UV/sun exposure - diffuse scaly erythematous macules with underlying dyspigmentation - Recommend daily broad spectrum sunscreen SPF 30+ to sun-exposed areas, reapply every 2 hours as needed.  - Staying in the shade or wearing long sleeves, sun glasses (UVA+UVB protection) and wide brim hats (4-inch brim around the entire circumference of the hat) are also recommended for sun protection.  - Call for new or changing lesions.  Skin cancer screening performed today.   Return in about 7 months (around 03/12/2022) for Aks.  IMarye Round, CMA, am acting as scribe for Sarina Ser, MD .  Documentation: I have reviewed the above documentation for accuracy and completeness, and I agree with the above.  Sarina Ser, MD

## 2021-08-12 NOTE — Patient Instructions (Addendum)

## 2021-08-13 ENCOUNTER — Encounter: Payer: Self-pay | Admitting: Dermatology

## 2021-09-29 ENCOUNTER — Telehealth (INDEPENDENT_AMBULATORY_CARE_PROVIDER_SITE_OTHER): Payer: 59 | Admitting: Family Medicine

## 2021-09-29 DIAGNOSIS — J069 Acute upper respiratory infection, unspecified: Secondary | ICD-10-CM

## 2021-09-29 DIAGNOSIS — J0110 Acute frontal sinusitis, unspecified: Secondary | ICD-10-CM | POA: Diagnosis not present

## 2021-09-29 DIAGNOSIS — R051 Acute cough: Secondary | ICD-10-CM

## 2021-09-29 MED ORDER — HYDROCODONE BIT-HOMATROP MBR 5-1.5 MG/5ML PO SOLN: 5.0000 mL | Freq: Three times a day (TID) | ORAL | 0 refills | Status: AC | PRN

## 2021-09-29 MED ORDER — FLUTICASONE PROPIONATE 50 MCG/ACT NA SUSP: 2.0000 | Freq: Every day | NASAL | 0 refills | Status: DC

## 2021-09-29 MED ORDER — AZITHROMYCIN 250 MG PO TABS: ORAL_TABLET | ORAL | 0 refills | Status: DC

## 2021-09-29 NOTE — Progress Notes (Signed)
° ° °  MyChart Video Visit    Virtual Visit via Video Note   This visit type was conducted due to national recommendations for restrictions regarding the COVID-19 Pandemic (e.g. social distancing) in an effort to limit this patient's exposure and mitigate transmission in our community. This patient is at least at moderate risk for complications without adequate follow up. This format is felt to be most appropriate for this patient at this time. Physical exam was limited by quality of the video and audio technology used for the visit.   Patient location: home Provider location: [facility]  I discussed the limitations of evaluation and management by telemedicine and the availability of in person appointments. The patient expressed understanding and agreed to proceed.  Patient: Shelly Hicks   DOB: 03-27-1961   61 y.o. Female  MRN: [id number] Visit Date: 09/29/2021  Today's healthcare provider: Lelon Huh, MD   Chief Complaint  Patient presents with   URI   Subjective     URI  This is a new problem. The current episode started 3 weeks ago (since February 1st). The problem has been gradually worsening. There has been no fever. Associated symptoms include congestion, coughing, headaches, sinus pain and sneezing. Pertinent negatives include no ear pain, plugged ear sensation, vomiting or wheezing. She has tried decongestant (Mucinex sinus and tablets) for the symptoms.      Medications: No outpatient medications prior to visit.   No facility-administered medications prior to visit.    Review of Systems  HENT:  Positive for congestion, sinus pain and sneezing. Negative for ear pain.   Respiratory:  Positive for cough. Negative for wheezing.   Gastrointestinal:  Negative for vomiting.  Neurological:  Positive for headaches.     Objective    LMP 08/20/2010 (Approximate)    Physical Exam   Awake, alert, oriented x 3. In no apparent distress   Assessment & Plan     1. Acute  frontal sinusitis, recurrence not specified  - azithromycin (ZITHROMAX) 250 MG tablet; Take 2 tabs on day one, take 1 tab on each of the following four days.  Dispense: 6 each; Refill: 0 - fluticasone (FLONASE) 50 MCG/ACT nasal spray; Place 2 sprays into both nostrils daily.  Dispense: 16 g; Refill: 0  2. URI with cough and congestion  - azithromycin (ZITHROMAX) 250 MG tablet; Take 2 tabs on day one, take 1 tab on each of the following four days.  Dispense: 6 each; Refill: 0       I discussed the assessment and treatment plan with the patient. The patient was provided an opportunity to ask questions and all were answered. The patient agreed with the plan and demonstrated an understanding of the instructions.   The patient was advised to call back or seek an in-person evaluation if the symptoms worsen or if the condition fails to improve as anticipated.  I provided 12 minutes of non-face-to-face time during this encounter.  The entirety of the information documented in the History of Present Illness, Review of Systems and Physical Exam were personally obtained by me. Portions of this information were initially documented by the CMA and reviewed by me for thoroughness and accuracy.    Lelon Huh, MD [facility] [phone] (phone) [phone] (fax)  [place]

## 2021-10-25 ENCOUNTER — Other Ambulatory Visit: Payer: Self-pay | Admitting: Family Medicine

## 2021-10-25 DIAGNOSIS — J0110 Acute frontal sinusitis, unspecified: Secondary | ICD-10-CM

## 2021-10-27 ENCOUNTER — Other Ambulatory Visit: Payer: Self-pay

## 2021-10-27 DIAGNOSIS — Z87891 Personal history of nicotine dependence: Secondary | ICD-10-CM

## 2021-11-04 ENCOUNTER — Other Ambulatory Visit: Payer: Self-pay

## 2021-11-04 ENCOUNTER — Ambulatory Visit
Admission: RE | Admit: 2021-11-04 | Discharge: 2021-11-04 | Disposition: A | Discharge status: Home / Self Care | Payer: 59 | Source: Ambulatory Visit | Attending: Acute Care | Admitting: Acute Care

## 2021-11-04 DIAGNOSIS — Z87891 Personal history of nicotine dependence: Secondary | ICD-10-CM | POA: Insufficient documentation

## 2021-11-06 ENCOUNTER — Other Ambulatory Visit: Payer: Self-pay

## 2021-11-06 DIAGNOSIS — Z87891 Personal history of nicotine dependence: Secondary | ICD-10-CM

## 2022-03-15 NOTE — Progress Notes (Signed)
Complete physical exam  I,[name],acting as a scribe for Lelon Huh, MD.,have documented all relevant documentation on the behalf of Lelon Huh, MD,as directed by  Lelon Huh, MD while in the presence of Lelon Huh, MD.   Patient: Shelly Hicks   DOB: 1961-05-30   61 y.o. Female  MRN: [id number] Visit Date: 03/16/2022  Today's healthcare provider: Lelon Huh, MD   No chief complaint on file.  Subjective    Shelly Hicks is a 61 y.o. female who presents today for a complete physical exam.  She reports consuming a  diet.  She generally feels well. She reports sleeping fairly well. She does not have additional problems to discuss today.     Past Medical History:  Diagnosis Date   Basal cell carcinoma 03/18/2021   left deltoid, EDC 04/20/21   Diverticulosis    History of chicken pox    History of measles    History of mumps    History of rib fracture    Past Surgical History:  Procedure Laterality Date   SHOULDER SURGERY Right 04/2016   TUBAL LIGATION  2000   Social History   Socioeconomic History   Marital status: Married    Spouse name: Not on file   Number of children: 2   Years of education: Not on file   Highest education level: Not on file  Occupational History   Not on file  Tobacco Use   Smoking status: Former    Packs/day: 1.00    Years: 30.00    Total pack years: 30.00    Types: Cigarettes    Quit date: 08/09/2009    Years since quitting: 12.6   Smokeless tobacco: Never  Vaping Use   Vaping Use: Some days  Substance and Sexual Activity   Alcohol use: Yes    Alcohol/week: 0.0 standard drinks of alcohol    Comment: occasional use   Drug use: No   Sexual activity: Not on file  Other Topics Concern   Not on file  Social History Narrative   Not on file   Social Determinants of Health   Financial Resource Strain: Not on file  Food Insecurity: Not on file  Transportation Needs: Not on file  Physical Activity: Not on  file  Stress: Not on file  Social Connections: Not on file  Intimate Partner Violence: Not on file   Family Status  Relation Name Status   Mother  Deceased   Father  Deceased at age 31   Brother half brother Alive   Neg Hx  (Not Specified)   Family History  Problem Relation Age of Onset   Hypertension Mother    Heart disease Neg Hx    Breast cancer Neg Hx    Colon cancer Neg Hx    No Known Allergies  Patient Care Team: Birdie Sons, MD as PCP - General (Family Medicine) Caryn Section, Kirstie Peri, MD as Referring Physician (Family Medicine) System, Provider Not In   Medications: Outpatient Medications Prior to Visit  Medication Sig   azithromycin (ZITHROMAX) 250 MG tablet Take 2 tabs on day one, take 1 tab on each of the following four days.   fluticasone (FLONASE) 50 MCG/ACT nasal spray SPRAY 2 SPRAYS INTO EACH NOSTRIL EVERY DAY   No facility-administered medications prior to visit.    Review of Systems  Constitutional:  Negative for chills, diaphoresis and fever.  HENT:  Negative for congestion, ear discharge, ear pain, hearing loss, nosebleeds, sore throat  and tinnitus.   Eyes:  Negative for photophobia, pain, discharge and redness.  Respiratory:  Negative for cough, shortness of breath, wheezing and stridor.   Cardiovascular:  Negative for chest pain, palpitations and leg swelling.  Gastrointestinal:  Negative for abdominal pain, blood in stool, constipation, diarrhea, nausea and vomiting.  Endocrine: Negative for polydipsia.  Genitourinary:  Negative for dysuria, flank pain, frequency, hematuria and urgency.  Musculoskeletal:  Negative for back pain, myalgias and neck pain.  Skin:  Negative for rash.  Allergic/Immunologic: Negative for environmental allergies.  Neurological:  Negative for dizziness, tremors, seizures, weakness and headaches.  Hematological:  Does not bruise/bleed easily.  Psychiatric/Behavioral:  Negative for hallucinations and suicidal ideas. The  patient is not nervous/anxious.       Objective    BP 121/71 (BP Location: Left Arm, Patient Position: Sitting, Cuff Size: Normal)   Pulse 64   Resp 14   Ht '5\' 7"'$  (1.702 m)   Wt 154 lb (69.9 kg)   LMP 08/20/2010 (Approximate)   SpO2 98%   BMI 24.12 kg/m     Physical Exam   General Appearance:    Well developed, well nourished female. Alert, cooperative, in no acute distress, appears stated age   Head:    Normocephalic, without obvious abnormality, atraumatic  Eyes:    PERRL, conjunctiva/corneas clear, EOM's intact, fundi    benign, both eyes  Ears:    Normal TM's and external ear canals, both ears  Nose:   Nares normal, septum midline, mucosa normal, no drainage    or sinus tenderness  Throat:   Lips, mucosa, and tongue normal; teeth and gums normal  Neck:   Supple, symmetrical, trachea midline, no adenopathy;    thyroid:  no enlargement/tenderness/nodules; no carotid   bruit or JVD  Back:     Symmetric, no curvature, ROM normal, no CVA tenderness  Lungs:     Clear to auscultation bilaterally, respirations unlabored  Chest Wall:    No tenderness or deformity   Heart:    Normal heart rate. Normal rhythm. No murmurs, rubs, or gallops.   Breast Exam:    deferred  Abdomen:     Soft, non-tender, bowel sounds active all four quadrants,    no masses, no organomegaly  Pelvic:    deferred  Extremities:   All extremities are intact. No cyanosis or edema  Pulses:   2+ and symmetric all extremities  Skin:   Skin color, texture, turgor normal, no rashes or lesions  Lymph nodes:   Cervical, supraclavicular, and axillary nodes normal  Neurologic:   CNII-XII intact, normal strength, sensation and reflexes    throughout     Last depression screening scores    03/13/2021    2:05 PM 12/27/2016    2:11 PM  PHQ 2/9 Scores  PHQ - 2 Score 0 0  PHQ- 9 Score  0   Last fall risk screening    03/13/2021    2:06 PM  [place] in the past year? 0  Number falls in past yr: 0   Injury with Fall? 0  Risk for fall due to : No Fall Risks  Follow up Falls evaluation completed   Last Audit-C alcohol use screening    03/13/2021    2:06 PM  Alcohol Use Disorder Test (AUDIT)  1. How often do you have a drink containing alcohol? 2  2. How many drinks containing alcohol do you have on a typical day when you are drinking?  0  3. How often do you have six or more drinks on one occasion? 0  AUDIT-C Score 2   A score of 3 or more in women, and 4 or more in men indicates increased risk for alcohol abuse, EXCEPT if all of the points are from question 1   No results found for any visits on 03/16/22.  Assessment & Plan    Routine Health Maintenance and Physical Exam  Exercise Activities and Dietary recommendations  Goals   None     Immunization History  Administered Date(s) Administered   Influenza-Unspecified 05/30/2015   Td 12/27/2016   Tdap 04/27/2006    Health Maintenance  Topic Date Due   COVID-19 Vaccine (1) Never done   HIV Screening  Never done   Zoster Vaccines- Shingrix (1 of 2) Never done   COLONOSCOPY (Pts 45-21yr Insurance coverage will need to be confirmed)  10/11/2020   INFLUENZA VACCINE  03/09/2022   MAMMOGRAM  07/28/2022   PAP SMEAR-Modifier  03/08/2024   TETANUS/TDAP  12/28/2026   Hepatitis C Screening  Completed   HPV VACCINES  Aged Out    Discussed health benefits of physical activity, and encouraged her to engage in regular exercise appropriate for her age and condition.   - CBC - Comprehensive metabolic panel - Lipid panel  2. Aortic atherosclerosis (HCC) BP and lipids have been good. Likely related to past smoking. Will consider if statins if ASCVD risk exceeds 7.5%  3. [facility]  - Ambulatory referral to Gastroenterology  4. History of smoking 30 or more pack years Quit >12 years ago. Continue LDCT another 2-3 years.      The entirety of  the information documented in the History of Present Illness, Review of Systems and Physical Exam were personally obtained by me. Portions of this information were initially documented by the CMA and reviewed by me for thoroughness and accuracy.     DLelon Huh MD  [phone](phone) [phone](fax)  [place]

## 2022-03-16 ENCOUNTER — Ambulatory Visit (INDEPENDENT_AMBULATORY_CARE_PROVIDER_SITE_OTHER): Payer: 59 | Admitting: Family Medicine

## 2022-03-16 ENCOUNTER — Encounter: Payer: Self-pay | Admitting: Family Medicine

## 2022-03-16 VITALS — BP 121/71 | HR 64 | Resp 14 | Ht 67.0 in | Wt 154.0 lb

## 2022-03-16 DIAGNOSIS — Z0000 Encounter for general adult medical examination without abnormal findings: Secondary | ICD-10-CM

## 2022-03-16 DIAGNOSIS — Z87891 Personal history of nicotine dependence: Secondary | ICD-10-CM

## 2022-03-16 DIAGNOSIS — Z1211 Encounter for screening for malignant neoplasm of colon: Secondary | ICD-10-CM | POA: Diagnosis not present

## 2022-03-16 DIAGNOSIS — I700 Atherosclerosis of aorta: Secondary | ICD-10-CM | POA: Diagnosis not present

## 2022-03-17 LAB — COMPREHENSIVE METABOLIC PANEL
ALT: 14 IU/L (ref 0–32)
AST: 15 IU/L (ref 0–40)
Albumin/Globulin Ratio: 1.5 (ref 1.2–2.2)
Albumin: 4.4 g/dL (ref 3.9–4.9)
Alkaline Phosphatase: 76 IU/L (ref 44–121)
BUN/Creatinine Ratio: 35 — ABNORMAL HIGH (ref 12–28)
BUN: 23 mg/dL (ref 8–27)
Bilirubin Total: 0.3 mg/dL (ref 0.0–1.2)
CO2: 21 mmol/L (ref 20–29)
Calcium: 9.8 mg/dL (ref 8.7–10.3)
Chloride: 100 mmol/L (ref 96–106)
Creatinine, Ser: 0.66 mg/dL (ref 0.57–1.00)
Globulin, Total: 3 g/dL (ref 1.5–4.5)
Glucose: 91 mg/dL (ref 70–99)
Potassium: 4.3 mmol/L (ref 3.5–5.2)
Sodium: 138 mmol/L (ref 134–144)
Total Protein: 7.4 g/dL (ref 6.0–8.5)
eGFR: 99 mL/min/{1.73_m2} (ref >59)

## 2022-03-17 LAB — CBC
Hematocrit: 38.8 % (ref 34.0–46.6)
Hemoglobin: 13.3 g/dL (ref 11.1–15.9)
MCH: 32 pg (ref 26.6–33.0)
MCHC: 34.3 g/dL (ref 31.5–35.7)
MCV: 94 fL (ref 79–97)
Platelets: 262 10*3/uL (ref 150–450)
RBC: 4.15 x10E6/uL (ref 3.77–5.28)
RDW: 12.3 % (ref 11.7–15.4)
WBC: 5.9 10*3/uL (ref 3.4–10.8)

## 2022-03-17 LAB — LIPID PANEL
Chol/HDL Ratio: 4.4 ratio (ref 0.0–4.4)
Cholesterol, Total: 227 mg/dL — ABNORMAL HIGH (ref 100–199)
HDL: 52 mg/dL (ref >39)
LDL Chol Calc (NIH): 146 mg/dL — ABNORMAL HIGH (ref 0–99)
Triglycerides: 164 mg/dL — ABNORMAL HIGH (ref 0–149)
VLDL Cholesterol Cal: 29 mg/dL (ref 5–40)

## 2022-03-24 ENCOUNTER — Ambulatory Visit: Payer: 59 | Admitting: Dermatology

## 2022-03-24 DIAGNOSIS — Z85828 Personal history of other malignant neoplasm of skin: Secondary | ICD-10-CM

## 2022-03-24 DIAGNOSIS — L821 Other seborrheic keratosis: Secondary | ICD-10-CM

## 2022-03-24 DIAGNOSIS — Z872 Personal history of diseases of the skin and subcutaneous tissue: Secondary | ICD-10-CM

## 2022-03-24 DIAGNOSIS — L409 Psoriasis, unspecified: Secondary | ICD-10-CM

## 2022-03-24 DIAGNOSIS — L578 Other skin changes due to chronic exposure to nonionizing radiation: Secondary | ICD-10-CM | POA: Diagnosis not present

## 2022-03-24 MED ORDER — MOMETASONE FUROATE 0.1 % EX SOLN: CUTANEOUS | 6 refills | Status: DC

## 2022-03-24 NOTE — Progress Notes (Signed)
   Follow-Up Visit   Subjective  Shelly Hicks is a 61 y.o. female who presents for the following: Actinic Keratosis (right nose, right cheek, upper lip- previously treated with LN2). The patient has spots, moles and lesions to be evaluated, some may be new or changing.  The following portions of the chart were reviewed this encounter and updated as appropriate:   Tobacco  Allergies  Meds  Problems  Med Hx  Surg Hx  Fam Hx     Review of Systems:  No other skin or systemic complaints except as noted in HPI or Assessment and Plan.  Objective  Well appearing patient in no apparent distress; mood and affect are within normal limits.  A focused examination was performed including the face. Relevant physical exam findings are noted in the Assessment and Plan.  R ant hairline Mild scale.   Face Clear.   Assessment & Plan  Psoriasis R ant hairline  Mild -  Psoriasis is a chronic non-curable, but treatable genetic/hereditary disease that may have other systemic features affecting other organ systems such as joints (Psoriatic Arthritis). It is associated with an increased risk of inflammatory bowel disease, heart disease, non-alcoholic fatty liver disease, and depression.    Start Mometasone 0.1% solution to aa's QD-BID up to 5d/wk. D/C once clear then restart PRN recurrence. Topical steroids (such as triamcinolone, fluocinolone, fluocinonide, mometasone, clobetasol, halobetasol, betamethasone, hydrocortisone) can cause thinning and lightening of the skin if they are used for too long in the same area. Your physician has selected the right strength medicine for your problem and area affected on the body. Please use your medication only as directed by your physician to prevent side effects.   mometasone (ELOCON) 0.1 % lotion - R ant hairline Apply to psoriasis in scalp QD-BID PRN up to five days per week.  History of actinic keratosis Face Clear. Observe for recurrence. Call clinic  for new or changing lesions.  Recommend regular skin exams, daily broad-spectrum spf 30+ sunscreen use, and photoprotection.    Actinic Damage - chronic, secondary to cumulative UV radiation exposure/sun exposure over time - diffuse scaly erythematous macules with underlying dyspigmentation - Recommend daily broad spectrum sunscreen SPF 30+ to sun-exposed areas, reapply every 2 hours as needed.  - Recommend staying in the shade or wearing long sleeves, sun glasses (UVA+UVB protection) and wide brim hats (4-inch brim around the entire circumference of the hat). - Call for new or changing lesions.  Seborrheic Keratoses - Stuck-on, waxy, tan-brown papules and/or plaques  - Benign-appearing - Discussed benign etiology and prognosis. - Observe - Call for any changes  History of Basal Cell Carcinoma of the Skin - L deltoid - No evidence of recurrence today - Recommend regular full body skin exams - Recommend daily broad spectrum sunscreen SPF 30+ to sun-exposed areas, reapply every 2 hours as needed.  - Call if any new or changing lesions are noted between office visits  Return in about 1 year (around 03/25/2023) for TBSE.  Luther Redo, CMA, am acting as scribe for Sarina Ser, MD . Documentation: I have reviewed the above documentation for accuracy and completeness, and I agree with the above.  Sarina Ser, MD

## 2022-03-24 NOTE — Patient Instructions (Signed)
Due to recent changes in healthcare laws, you may see results of your pathology and/or laboratory studies on MyChart before the doctors have had a chance to review them. We understand that in some cases there may be results that are confusing or concerning to you. Please understand that not all results are received at the same time and often the doctors may need to interpret multiple results in order to provide you with the best plan of care or course of treatment. Therefore, we ask that you please give us 2 business days to thoroughly review all your results before contacting the office for clarification. Should we see a critical lab result, you will be contacted sooner.   If You Need Anything After Your Visit  If you have any questions or concerns for your doctor, please call our main line at [phone] and press option 4 to reach your doctor's medical assistant. If no one answers, please leave a voicemail as directed and we will return your call as soon as possible. Messages left after 4 pm will be answered the following business day.   You may also send us a message via MyChart. We typically respond to MyChart messages within 1-2 business days.  For prescription refills, please ask your pharmacy to contact our office. Our fax number is [phone].  If you have an urgent issue when the clinic is closed that cannot wait until the next business day, you can page your doctor at the number below.    Please note that while we do our best to be available for urgent issues outside of office hours, we are not available 24/7.   If you have an urgent issue and are unable to reach us, you may choose to seek medical care at your doctor's office, retail clinic, urgent care center, or emergency room.  If you have a medical emergency, please immediately call 911 or go to the emergency department.  Pager Numbers  - Dr. Kowalski: [phone]  - Dr. Moye: [phone]  - Dr. Stewart:  [phone]  In the event of inclement weather, please call our main line at [phone] for an update on the status of any delays or closures.  Dermatology Medication Tips: Please keep the boxes that topical medications come in in order to help keep track of the instructions about where and how to use these. Pharmacies typically print the medication instructions only on the boxes and not directly on the medication tubes.   If your medication is too expensive, please contact our office at [phone] option 4 or send us a message through MyChart.   We are unable to tell what your co-pay for medications will be in advance as this is different depending on your insurance coverage. However, we may be able to find a substitute medication at lower cost or fill out paperwork to get insurance to cover a needed medication.   If a prior authorization is required to get your medication covered by your insurance company, please allow us 1-2 business days to complete this process.  Drug prices often vary depending on where the prescription is filled and some pharmacies may offer cheaper prices.  The website www.goodrx.com contains coupons for medications through different pharmacies. The prices here do not account for what the cost may be with help from insurance (it may be cheaper with your insurance), but the website can give you the price if you did not use any insurance.  - You can print the associated coupon and take it with   your prescription to the pharmacy.  - You may also stop by our office during regular business hours and pick up a GoodRx coupon card.  - If you need your prescription sent electronically to a different pharmacy, notify our office through [facility] MyChart or by phone at [phone] option 4.     Si Usted Necesita Algo Despus de Su Visita  Tambin puede enviarnos un mensaje a travs de MyChart. Por lo general respondemos a los mensajes de MyChart en el transcurso de 1 a 2  das hbiles.  Para renovar recetas, por favor pida a su farmacia que se ponga en contacto con nuestra oficina. Nuestro nmero de fax es el [phone].  Si tiene un asunto urgente cuando la clnica est cerrada y que no puede esperar hasta el siguiente da hbil, puede llamar/localizar a su doctor(a) al nmero que aparece a continuacin.   Por favor, tenga en cuenta que aunque hacemos todo lo posible para estar disponibles para asuntos urgentes fuera del horario de oficina, no estamos disponibles las 24 horas del da, los 7 das de la semana.   Si tiene un problema urgente y no puede comunicarse con nosotros, puede optar por buscar atencin mdica  en el consultorio de su doctor(a), en una clnica privada, en un centro de atencin urgente o en una sala de emergencias.  Si tiene una emergencia mdica, por favor llame inmediatamente al 911 o vaya a la sala de emergencias.  Nmeros de bper  - Dr. Kowalski: [phone]  - Dra. Moye: [phone]  - Dra. Stewart: [phone]  En caso de inclemencias del tiempo, por favor llame a nuestra lnea principal al [phone] para una actualizacin sobre el estado de cualquier retraso o cierre.  Consejos para la medicacin en dermatologa: Por favor, guarde las cajas en las que vienen los medicamentos de uso tpico para ayudarle a seguir las instrucciones sobre dnde y cmo usarlos. Las farmacias generalmente imprimen las instrucciones del medicamento slo en las cajas y no directamente en los tubos del medicamento.   Si su medicamento es muy caro, por favor, pngase en contacto con nuestra oficina llamando al [phone] y presione la opcin 4 o envenos un mensaje a travs de MyChart.   No podemos decirle cul ser su copago por los medicamentos por adelantado ya que esto es diferente dependiendo de la cobertura de su seguro. Sin embargo, es posible que podamos encontrar un medicamento sustituto a menor costo o llenar un formulario para que el  seguro cubra el medicamento que se considera necesario.   Si se requiere una autorizacin previa para que su compaa de seguros cubra su medicamento, por favor permtanos de 1 a 2 das hbiles para completar este proceso.  Los precios de los medicamentos varan con frecuencia dependiendo del lugar de dnde se surte la receta y alguna farmacias pueden ofrecer precios ms baratos.  El sitio web www.goodrx.com tiene cupones para medicamentos de diferentes farmacias. Los precios aqu no tienen en cuenta lo que podra costar con la ayuda del seguro (puede ser ms barato con su seguro), pero el sitio web puede darle el precio si no utiliz ningn seguro.  - Puede imprimir el cupn correspondiente y llevarlo con su receta a la farmacia.  - Tambin puede pasar por nuestra oficina durante el horario de atencin regular y recoger una tarjeta de cupones de GoodRx.  - Si necesita que su receta se enve electrnicamente a una farmacia diferente, informe a nuestra oficina a travs de MyChart de [place]   o por telfono llamando al [phone] y presione la opcin 4.  

## 2022-03-30 ENCOUNTER — Encounter: Payer: Self-pay | Admitting: Dermatology

## 2022-10-04 ENCOUNTER — Telehealth (INDEPENDENT_AMBULATORY_CARE_PROVIDER_SITE_OTHER): Payer: 59 | Admitting: Family Medicine

## 2022-10-04 DIAGNOSIS — J0110 Acute frontal sinusitis, unspecified: Secondary | ICD-10-CM | POA: Diagnosis not present

## 2022-10-04 DIAGNOSIS — R051 Acute cough: Secondary | ICD-10-CM

## 2022-10-04 MED ORDER — AZITHROMYCIN 250 MG PO TABS: ORAL_TABLET | ORAL | 0 refills | Status: AC

## 2022-10-04 MED ORDER — HYDROCODONE BIT-HOMATROP MBR 5-1.5 MG/5ML PO SOLN: 5.0000 mL | Freq: Three times a day (TID) | ORAL | 0 refills | Status: DC | PRN

## 2022-10-04 NOTE — Progress Notes (Signed)
Argentina Ponder De[place]anto,acting as a scribe for Lelon Huh, MD.,have documented all relevant documentation on the behalf of Lelon Huh, MD,as directed by  Lelon Huh, MD while in the presence of Lelon Huh, MD.   MyChart Video Visit    Virtual Visit via Video Note   This format is felt to be most appropriate for this patient at this time. Physical exam was limited by quality of the video and audio technology used for the visit.   Patient location: home Provider location: office  I discussed the limitations of evaluation and management by telemedicine and the availability of in person appointments. The patient expressed understanding and agreed to proceed.  Patient: Shelly Hicks   DOB: 1961/08/09   62 y.o. Female  MRN: [id number] Visit Date: 10/04/2022  Today's healthcare provider: Lelon Huh, MD   No chief complaint on file.  Subjective    HPI  Patient is a 62 year old female who presents via video visit for evaluation of cold and sinus symptoms.   She states her symptoms began on 10/01/22 with fatigue, head congestion, sinus pain and pressure.  She has sweats and chills the first couple of days.  She reports having cough-non productive and yellow drainage from her eyes as well.  She tested on 10/03/22 for Covid and it was negative.   She has been using Zyrtec, Pataday eye drops, and fluticasone nose spray.  She reports when she blows her nose it causes pain in her eyes.  She states she has pain in her ribs from coughing so much and so hard.   Medications: Outpatient Medications Prior to Visit  Medication Sig   [DISCONTINUED] mometasone (ELOCON) 0.1 % lotion Apply to psoriasis in scalp QD-BID PRN up to five days per week.   No facility-administered medications prior to visit.    Review of Systems  Constitutional:  Positive for fatigue. Negative for fever.  HENT:  Positive for congestion, nosebleeds, postnasal drip, sinus pressure, sinus pain, sneezing and sore  throat. Negative for ear discharge, ear pain, hearing loss, rhinorrhea and tinnitus.   Eyes:  Positive for discharge.  Respiratory:  Positive for cough. Negative for shortness of breath and wheezing.   Cardiovascular:  Negative for chest pain.  Gastrointestinal:  Negative for abdominal pain, constipation and diarrhea.      Objective    LMP 08/20/2010 (Approximate)    Physical Exam   Awake, alert, oriented x 3. In no apparent distress   Assessment & Plan     1. Acute non-recurrent frontal sinusitis  - azithromycin (ZITHROMAX) 250 MG tablet; Take 2 tablets on day 1, then 1 tablet daily on days 2 through 5  Dispense: 6 tablet; Refill: 0  2. Acute cough  - HYDROcodone bit-homatropine (HYCODAN) 5-1.5 MG/5ML syrup; Take 5 mLs by mouth every 8 (eight) hours as needed for cough.  Dispense: 120 mL; Refill: 0   Call if symptoms change or if not rapidly improving.       I discussed the assessment and treatment plan with the patient. The patient was provided an opportunity to ask questions and all were answered. The patient agreed with the plan and demonstrated an understanding of the instructions.   The patient was advised to call back or seek an in-person evaluation if the symptoms worsen or if the condition fails to improve as anticipated.  I provided 8 minutes of non-face-to-face time during this encounter.  The entirety of the information documented in the History of Present Illness, Review  of Systems and Physical Exam were personally obtained by me. Portions of this information were initially documented by the CMA and reviewed by me for thoroughness and accuracy.    Lelon Huh, MD [place] [phone] (phone) [phone] (fax)  [place]

## 2022-10-28 IMAGING — CT CT CHEST LUNG CANCER SCREENING LOW DOSE W/O CM
2 of 5 series · 15 of 40 positions shown, 18 images · non-contrast
Comparison: 05/23/2020.

CLINICAL DATA: Former smoker, quit 11 years ago, 30 pack-year
history.



[Series 3: lung 1.00 · axial · 0.57mm/px · z∈[-1187,-876]mm · 12 of 343 slices shown, 15 images]
[im 16/343  mediastinal]
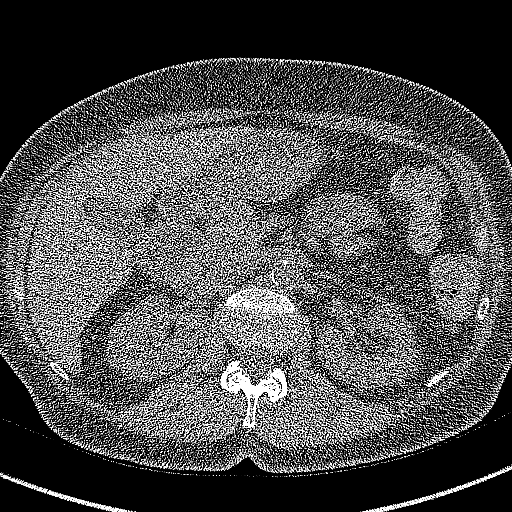
[im 16/343  lung]
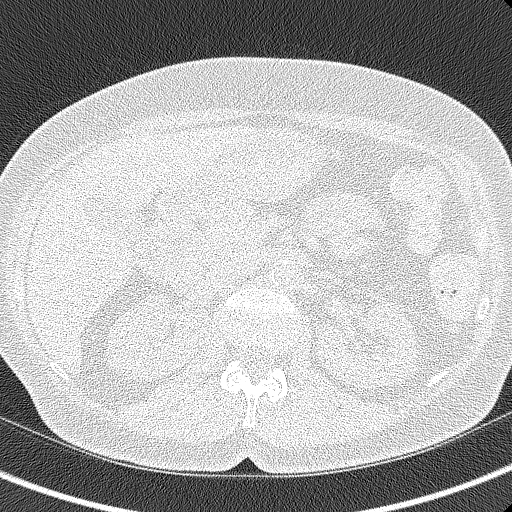
[im 47/343  lung]
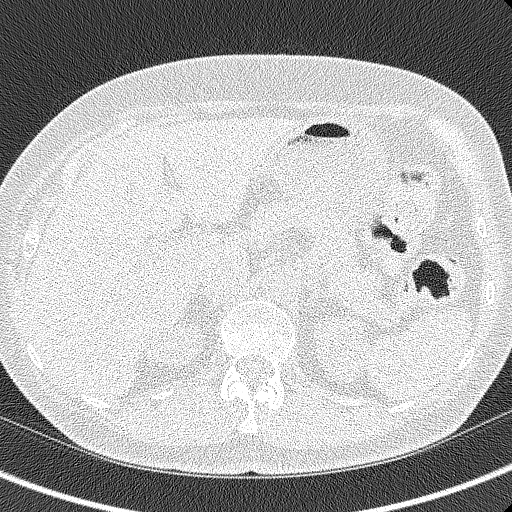
[im 78/343  lung]
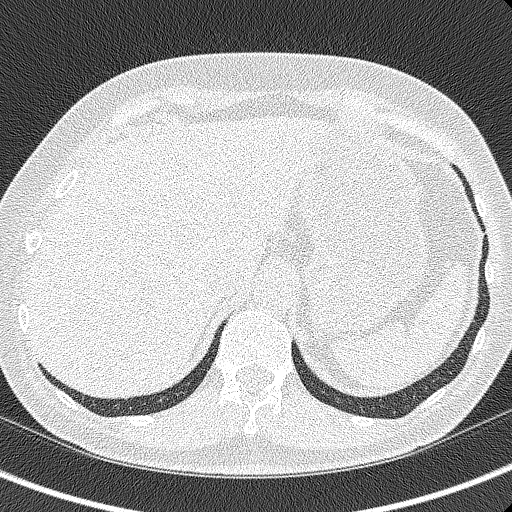
[im 109/343  lung]
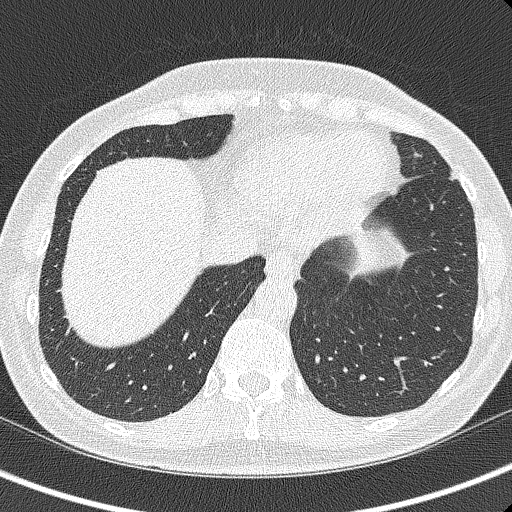
[im 125/343  mediastinal]
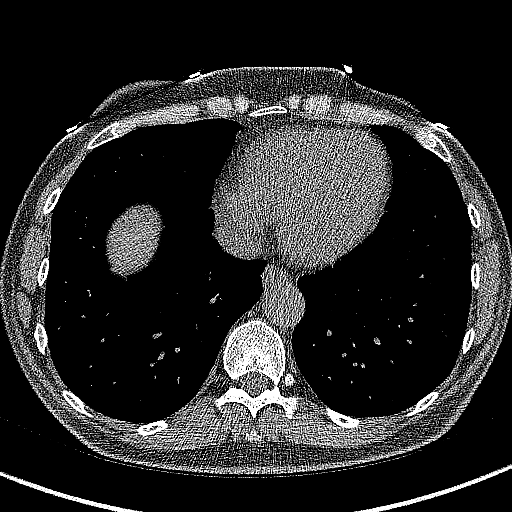
[im 125/343  lung]
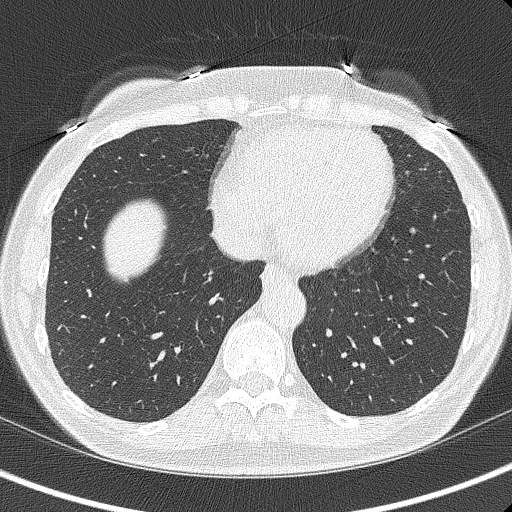
[im 156/343  lung]
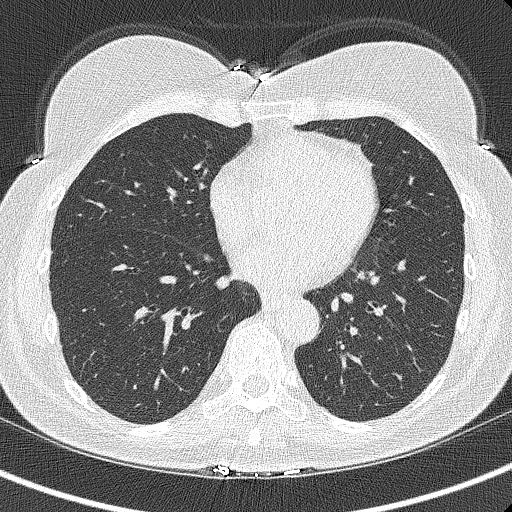
[im 187/343  lung]
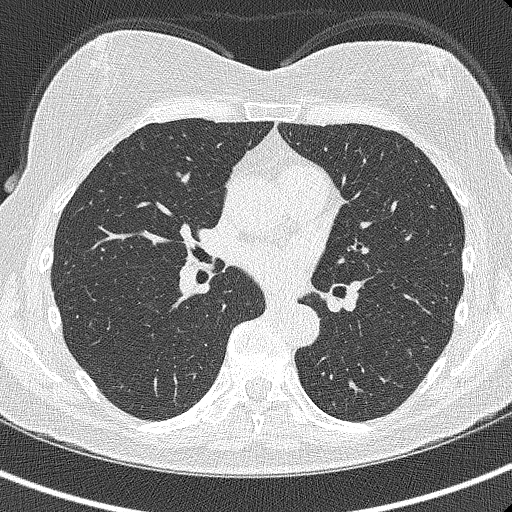
[im 218/343  lung]
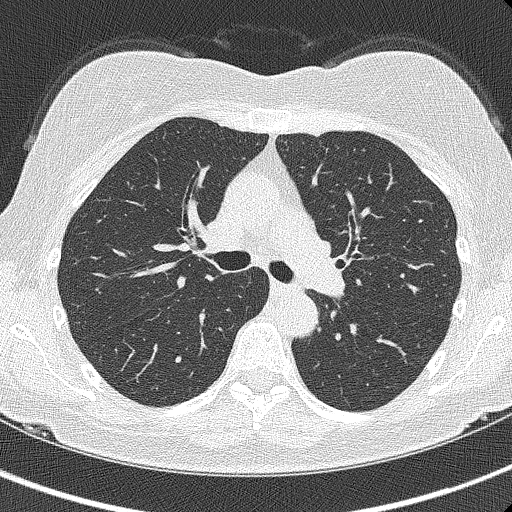
[im 234/343  mediastinal]
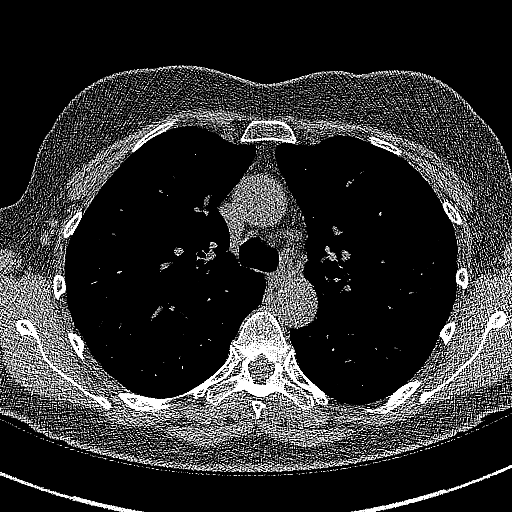
[im 234/343  lung]
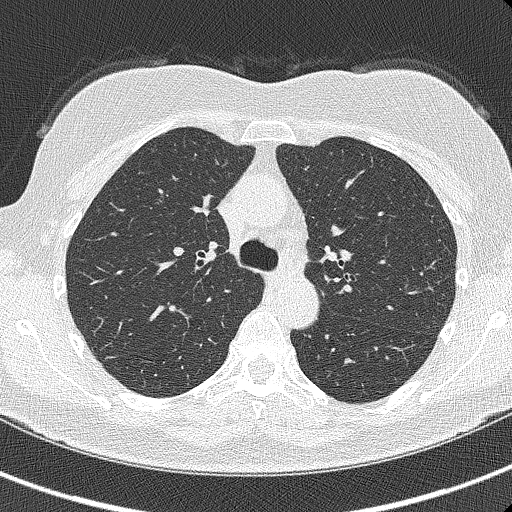
[im 265/343  lung]
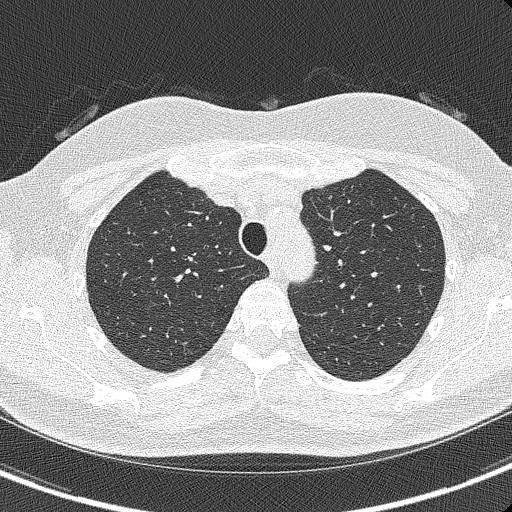
[im 296/343  lung]
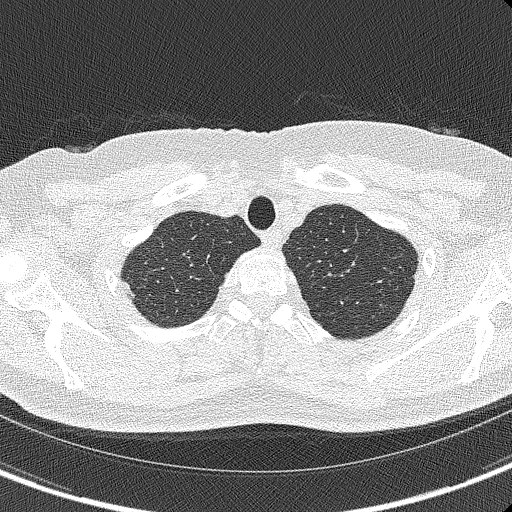
[im 327/343  lung]
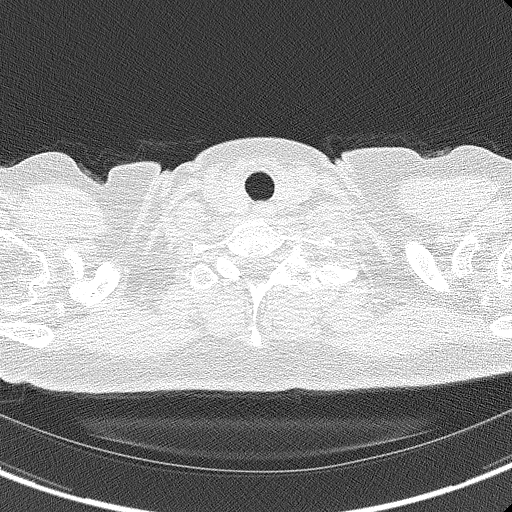

[Series 5: coronals lung 1.00 cor · coronal · 0.57mm/px · 3 of 292 slices shown]
[im 59/292  lung]
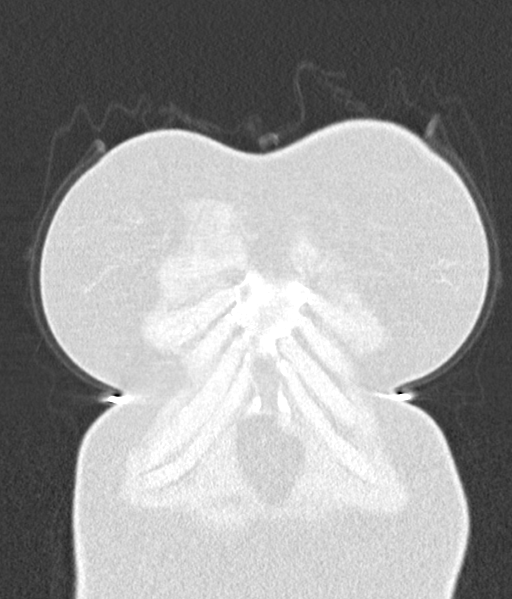
[im 117/292  lung]
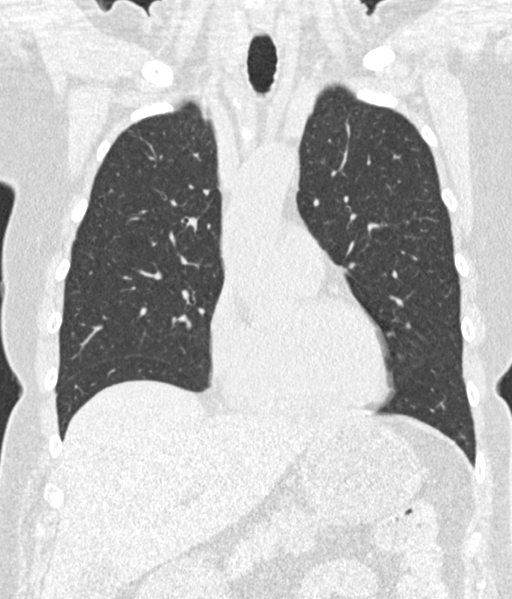
[im 175/292  lung]
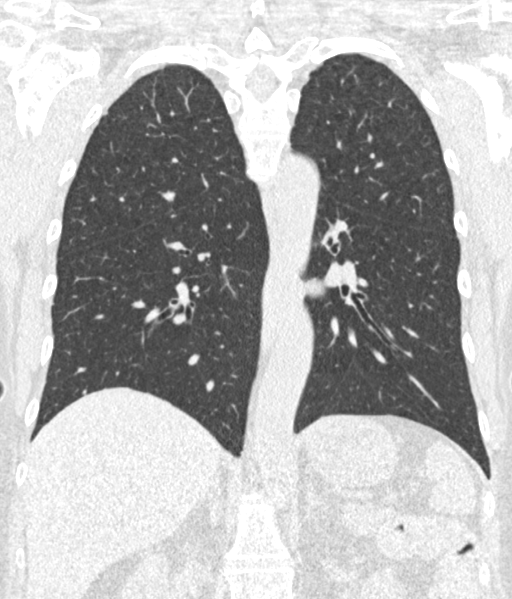

[15 of 40 positions shown; findings below may reference images not displayed]

FINDINGS: Cardiovascular: Atherosclerotic calcification of the aorta and
aortic valve. Heart size normal. No pericardial effusion.

Mediastinum/Nodes: No pathologically enlarged mediastinal or
axillary lymph nodes. Hilar regions are difficult to definitively
evaluate without IV contrast. Esophagus is grossly unremarkable.

Lungs/Pleura: Mild biapical pleuroparenchymal scarring.
Centrilobular and paraseptal emphysema. Calcified granulomas.
Pulmonary nodules measure up to 5.8 mm, as before. No pleural fluid.
Airway is unremarkable.

Upper Abdomen: Visualized portions of the liver, gallbladder,
adrenal glands, kidneys, spleen, pancreas, stomach and bowel are
grossly unremarkable.

Musculoskeletal: Old bilateral rib fractures. No worrisome lytic or
sclerotic lesions. Mild anterior wedging of mid/lower thoracic
vertebral bodies, unchanged.
IMPRESSION: 1. Lung-RADS 2, benign appearance or behavior. Continue annual
screening with low-dose chest CT without contrast in 12 months.
2.  Aortic atherosclerosis (9MFKU-BVI.I).
3.  Emphysema (9MFKU-Q66.T).

## 2022-11-05 ENCOUNTER — Ambulatory Visit
Admission: RE | Admit: 2022-11-05 | Discharge: 2022-11-05 | Disposition: A | Discharge status: Home / Self Care | Payer: 59 | Source: Ambulatory Visit | Attending: Family Medicine | Admitting: Family Medicine

## 2022-11-05 DIAGNOSIS — Z87891 Personal history of nicotine dependence: Secondary | ICD-10-CM | POA: Insufficient documentation

## 2022-11-08 ENCOUNTER — Other Ambulatory Visit: Payer: Self-pay | Admitting: Acute Care

## 2022-11-08 DIAGNOSIS — Z87891 Personal history of nicotine dependence: Secondary | ICD-10-CM

## 2022-11-08 DIAGNOSIS — Z122 Encounter for screening for malignant neoplasm of respiratory organs: Secondary | ICD-10-CM

## 2023-03-23 ENCOUNTER — Encounter: Payer: Self-pay | Admitting: Family Medicine

## 2023-03-23 ENCOUNTER — Other Ambulatory Visit: Payer: Self-pay | Admitting: Family Medicine

## 2023-03-23 ENCOUNTER — Ambulatory Visit: Payer: Self-pay

## 2023-03-23 ENCOUNTER — Ambulatory Visit: Payer: 59 | Admitting: Family Medicine

## 2023-03-23 VITALS — BP 132/69 | HR 80 | Ht 67.0 in | Wt 161.2 lb

## 2023-03-23 DIAGNOSIS — J209 Acute bronchitis, unspecified: Secondary | ICD-10-CM

## 2023-03-23 DIAGNOSIS — J440 Chronic obstructive pulmonary disease with acute lower respiratory infection: Secondary | ICD-10-CM

## 2023-03-23 MED ORDER — AZITHROMYCIN 500 MG PO TABS: 500.0000 mg | ORAL_TABLET | Freq: Every day | ORAL | 0 refills | Status: DC

## 2023-03-23 MED ORDER — CETIRIZINE HCL 10 MG PO TABS: 10.0000 mg | ORAL_TABLET | Freq: Every day | ORAL | 11 refills | Status: AC

## 2023-03-23 MED ORDER — MONTELUKAST SODIUM 10 MG PO TABS: 10.0000 mg | ORAL_TABLET | Freq: Every day | ORAL | 1 refills | Status: AC

## 2023-03-23 MED ORDER — PREDNISONE 50 MG PO TABS: 50.0000 mg | ORAL_TABLET | Freq: Every day | ORAL | 0 refills | Status: DC

## 2023-03-23 MED ORDER — AZELASTINE-FLUTICASONE 137-50 MCG/ACT NA SUSP: 1.0000 | Freq: Two times a day (BID) | NASAL | 1 refills | Status: DC

## 2023-03-23 NOTE — Assessment & Plan Note (Signed)
Acute on chronic with reoccurrence Last episode 2/24 Declines use of augmentin or addition of doxy to ensure coverage to cover all possible bacteria Negative COVID test Declines STREP given c/f sore throat improving Encouraged to seek ENT for recurrent issues Start zyrtec and Singulair daily Add nasal spray during flares  Start pred 8/15 in am Use Abx x7 days

## 2023-03-23 NOTE — Progress Notes (Signed)
Established patient visit   Patient: Shelly Hicks   DOB: 08-Jan-1961   62 y.o. Female  MRN: [id number] Visit Date: 03/23/2023  Today's healthcare provider: Jacky Kindle, FNP  Introduced to nurse practitioner role and practice setting.  All questions answered.  Discussed provider/patient relationship and expectations.  Subjective    HPI HPI     Facial Pain    Additional comments: Associated with headache, cough, sore throat and yellow mucus X 2 days Taking: mucinex for sore throat, alka seltzer max sinus      Last edited by Acey Lav, CMA on 03/23/2023  3:59 PM.      Medications: Outpatient Medications Prior to Visit  Medication Sig   [DISCONTINUED] HYDROcodone bit-homatropine (HYCODAN) 5-1.5 MG/5ML syrup Take 5 mLs by mouth every 8 (eight) hours as needed for cough.   No facility-administered medications prior to visit.    Review of Systems    Objective    BP 132/69 (BP Location: Right Arm, Patient Position: Sitting, Cuff Size: Normal)   Pulse 80   Ht 5\' 7"  (1.702 m)   Wt 161 lb 3.2 oz (73.1 kg)   LMP 08/20/2010 (Approximate)   SpO2 97%   BMI 25.25 kg/m   Physical Exam Vitals and nursing note reviewed.  Constitutional:      General: She is not in acute distress.    Appearance: Normal appearance. She is overweight. She is ill-appearing. She is not toxic-appearing or diaphoretic.  HENT:     Head: Normocephalic and atraumatic.     Right Ear: Tympanic membrane, ear canal and external ear normal.     Left Ear: Tympanic membrane, ear canal and external ear normal.     Nose: Rhinorrhea present. No congestion.     Mouth/Throat:     Mouth: Mucous membranes are moist.     Pharynx: Oropharynx is clear.  Eyes:     Extraocular Movements: Extraocular movements intact.     Conjunctiva/sclera: Conjunctivae normal.  Cardiovascular:     Rate and Rhythm: Normal rate and regular rhythm.     Pulses: Normal pulses.     Heart sounds: Normal heart sounds. No  murmur heard.    No friction rub. No gallop.  Pulmonary:     Effort: Pulmonary effort is normal. No respiratory distress.     Breath sounds: No stridor. Wheezing present. No rhonchi or rales.  Chest:     Chest wall: No tenderness.  Musculoskeletal:        General: No swelling, tenderness, deformity or signs of injury. Normal range of motion.     Right lower leg: No edema.     Left lower leg: No edema.  Skin:    General: Skin is warm and dry.     Capillary Refill: Capillary refill takes less than 2 seconds.     Coloration: Skin is not jaundiced or pale.     Findings: No bruising, erythema, lesion or rash.  Neurological:     General: No focal deficit present.     Mental Status: She is alert and oriented to person, place, and time. Mental status is at baseline.     Cranial Nerves: No cranial nerve deficit.     Sensory: No sensory deficit.     Motor: No weakness.     Coordination: Coordination normal.  Psychiatric:        Mood and Affect: Mood normal.        Behavior: Behavior normal.  Thought Content: Thought content normal.        Judgment: Judgment normal.      No results found for any visits on 03/23/23.  Assessment & Plan     Problem List Items Addressed This Visit       Respiratory   Acute bronchitis with COPD (HCC) - Primary    Acute on chronic with reoccurrence Last episode 2/24 Declines use of augmentin or addition of doxy to ensure coverage to cover all possible bacteria Negative COVID test Declines STREP given c/f sore throat improving Encouraged to seek ENT for recurrent issues Start zyrtec and Singulair daily Add nasal spray during flares  Start pred 8/15 in am Use Abx x7 days      Relevant Medications   predniSONE (DELTASONE) 50 MG tablet   Azelastine-Fluticasone 137-50 MCG/ACT SUSP   cetirizine (ZYRTEC) 10 MG tablet   montelukast (SINGULAIR) 10 MG tablet   azithromycin (ZITHROMAX) 500 MG tablet   Return if symptoms worsen or fail to improve.      Leilani Merl, FNP, have reviewed all documentation for this visit. The documentation on 03/23/23 for the exam, diagnosis, procedures, and orders are all accurate and complete.  Jacky Kindle, FNP  [facility] [phone] (phone) [phone] (fax)  [facility]

## 2023-03-23 NOTE — Telephone Encounter (Signed)
  Head pain, sore throat, cough, head pressure, (3 days)     Chief Complaint: Sinus pain, headache, cough, sore throat, yellow mucus. Symptoms: Above Frequency: 2 days ago Pertinent Negatives: Patient denies fever Disposition: [] ED /[] Urgent Care (no appt availability in office) / [x] Appointment(In office/virtual)/ []  [facility]/ [] Home Care/ [] Refused Recommended Disposition /[]  Mobile Bus/ []  Follow-up with PCP Additional Notes: Pt. Agrees with appointment.  Reason for Disposition  Lots of coughing  Answer Assessment - Initial Assessment Questions 1. LOCATION: "Where does it hurt?"      Face, headache 2. ONSET: "When did the sinus pain start?"  (e.g., hours, days)      2 days ago 3. SEVERITY: "How bad is the pain?"   (Scale 1-10; mild, moderate or severe)   - MILD (1-3): doesn't interfere with normal activities    - MODERATE (4-7): interferes with normal activities (e.g., work or school) or awakens from sleep   - SEVERE (8-10): excruciating pain and patient unable to do any normal activities        Severe 4. RECURRENT SYMPTOM: "Have you ever had sinus problems before?" If Yes, ask: "When was the last time?" and "What happened that time?"      Yes 5. NASAL CONGESTION: "Is the nose blocked?" If Yes, ask: "Can you open it or must you breathe through your mouth?"     Yes 6. NASAL DISCHARGE: "Do you have discharge from your nose?" If so ask, "What color?"     Yellow 7. FEVER: "Do you have a fever?" If Yes, ask: "What is it, how was it measured, and when did it start?"      No 8. OTHER SYMPTOMS: "Do you have any other symptoms?" (e.g., sore throat, cough, earache, difficulty breathing)     Sore throat 9. PREGNANCY: "Is there any chance you are pregnant?" "When was your last menstrual period?"     No  Protocols used: Sinus Pain or Congestion-A-AH

## 2023-03-24 ENCOUNTER — Other Ambulatory Visit: Payer: Self-pay | Admitting: Family Medicine

## 2023-03-24 ENCOUNTER — Ambulatory Visit: Payer: Self-pay | Admitting: *Deleted

## 2023-03-24 ENCOUNTER — Telehealth: Payer: Self-pay | Admitting: Family Medicine

## 2023-03-24 MED ORDER — FLUTICASONE PROPIONATE 50 MCG/ACT NA SUSP: 2.0000 | Freq: Every day | NASAL | 6 refills | Status: AC

## 2023-03-24 MED ORDER — AZELASTINE HCL 0.1 % NA SOLN: 2.0000 | Freq: Two times a day (BID) | NASAL | 12 refills | Status: DC

## 2023-03-24 NOTE — Telephone Encounter (Signed)
Summary: medicaiton questions   Pt is calling to make sure azithromycin (ZITHROMAX) 500 MG tablet is the same thing as a z pack. Per pt she was told yesterday the she would have a z pack called in for her. Pt is also wants to know if all 5 medications that was called in yesterday if she is suppose to take them. Please call back to advise.

## 2023-03-24 NOTE — Telephone Encounter (Signed)
Pt states per the Pharmacy she is needing Prior Authorization for Azelastine-Fluticasone 137-50 MCG/ACT SUSP. Please advise.       [id number] [place], [place] - [address] [place], [place] [address] Phone: [phone]  Fax: [phone]

## 2023-03-24 NOTE — Telephone Encounter (Signed)
Patient called, left VM to return the call to the office to speak to the NT.  3rd attempt, forwarding message to the practice at this time. Summary: medicaiton questions   Pt is calling to make sure azithromycin (ZITHROMAX) 500 MG tablet is the same thing as a z pack. Per pt she was told yesterday the she would have a z pack called in for her. Pt is also wants to know if all 5 medications that was called in yesterday if she is suppose to take them. Please call back to advise.

## 2023-03-25 NOTE — Telephone Encounter (Signed)
Coverage for Azelastine was denied. Why was the request denied? The request was denied because the medication/or diagnosis are not a covered benefit  and excluded from coverage in accordance with the terms and conditions of thepatient's plan benefit.

## 2023-03-25 NOTE — Telephone Encounter (Signed)
PA initiated

## 2023-03-25 NOTE — Telephone Encounter (Signed)
Spoke with patient and gave providers message and answered all her questions.

## 2023-04-13 ENCOUNTER — Ambulatory Visit: Payer: 59 | Admitting: Dermatology

## 2023-04-13 VITALS — BP 137/73

## 2023-04-13 DIAGNOSIS — D229 Melanocytic nevi, unspecified: Secondary | ICD-10-CM

## 2023-04-13 DIAGNOSIS — Z79899 Other long term (current) drug therapy: Secondary | ICD-10-CM

## 2023-04-13 DIAGNOSIS — L700 Acne vulgaris: Secondary | ICD-10-CM

## 2023-04-13 DIAGNOSIS — Z7189 Other specified counseling: Secondary | ICD-10-CM

## 2023-04-13 DIAGNOSIS — Z1283 Encounter for screening for malignant neoplasm of skin: Secondary | ICD-10-CM

## 2023-04-13 DIAGNOSIS — L578 Other skin changes due to chronic exposure to nonionizing radiation: Secondary | ICD-10-CM

## 2023-04-13 DIAGNOSIS — L814 Other melanin hyperpigmentation: Secondary | ICD-10-CM | POA: Diagnosis not present

## 2023-04-13 DIAGNOSIS — Z85828 Personal history of other malignant neoplasm of skin: Secondary | ICD-10-CM

## 2023-04-13 DIAGNOSIS — L409 Psoriasis, unspecified: Secondary | ICD-10-CM

## 2023-04-13 MED ORDER — MOMETASONE FUROATE 0.1 % EX SOLN: CUTANEOUS | 6 refills | Status: AC

## 2023-04-13 MED ORDER — TRETINOIN 0.025 % EX CREA: TOPICAL_CREAM | Freq: Every day | CUTANEOUS | 11 refills | Status: DC

## 2023-04-13 NOTE — Patient Instructions (Signed)

## 2023-04-13 NOTE — Progress Notes (Unsigned)
Follow-Up Visit   Subjective  Shelly Hicks is a 62 y.o. female who presents for the following: Skin Cancer Screening and Full Body Skin Exam, hx of BCC, hx of Aks, Psoriasis R ant hairline, Mometasone lotion prn   The patient presents for Total-Body Skin Exam (TBSE) for skin cancer screening and mole check. The patient has spots, moles and lesions to be evaluated, some may be new or changing and the patient may have concern these could be cancer.    The following portions of the chart were reviewed this encounter and updated as appropriate: medications, allergies, medical history  Review of Systems:  No other skin or systemic complaints except as noted in HPI or Assessment and Plan.  Objective  Well appearing patient in no apparent distress; mood and affect are within normal limits.  A full examination was performed including scalp, head, eyes, ears, nose, lips, neck, chest, axillae, abdomen, back, buttocks, bilateral upper extremities, bilateral lower extremities, hands, feet, fingers, toes, fingernails, and toenails. All findings within normal limits unless otherwise noted below.   Relevant physical exam findings are noted in the Assessment and Plan.    Assessment & Plan   SKIN CANCER SCREENING PERFORMED TODAY.  ACTINIC DAMAGE - Chronic condition, secondary to cumulative UV/sun exposure - diffuse scaly erythematous macules with underlying dyspigmentation - Recommend daily broad spectrum sunscreen SPF 30+ to sun-exposed areas, reapply every 2 hours as needed.  - Staying in the shade or wearing long sleeves, sun glasses (UVA+UVB protection) and wide brim hats (4-inch brim around the entire circumference of the hat) are also recommended for sun protection.  - Call for new or changing lesions.  LENTIGINES, SEBORRHEIC KERATOSES, HEMANGIOMAS - Benign normal skin lesions - Benign-appearing - Call for any changes  MELANOCYTIC NEVI - Tan-brown and/or pink-flesh-colored symmetric  macules and papules - Benign appearing on exam today - Observation - Call clinic for new or changing moles - Recommend daily use of broad spectrum spf 30+ sunscreen to sun-exposed areas.   HISTORY OF BASAL CELL CARCINOMA OF THE SKIN - No evidence of recurrence today - Recommend regular full body skin exams - Recommend daily broad spectrum sunscreen SPF 30+ to sun-exposed areas, reapply every 2 hours as needed.  - Call if any new or changing lesions are noted between office visits  - L deltoid 04/20/21  PSORIASIS R ant hairline Exam:mild scale R ant hairline. 1% BSA.  Chronic and persistent condition with duration or expected duration over one year. Condition is improving with treatment but not currently at goal.   patient denies joint pain  Psoriasis is a chronic non-curable, but treatable genetic/hereditary disease that may have other systemic features affecting other organ systems such as joints (Psoriatic Arthritis). It is associated with an increased risk of inflammatory bowel disease, heart disease, non-alcoholic fatty liver disease, and depression.  Treatments include light and laser treatments; topical medications; and systemic medications including oral and injectables.  Treatment Plan: Cont Mometasone lotion qd up to 5d/wk aa scalp prn flares   Long term medication management.  Patient is using long term (months to years) prescription medication  to control their dermatologic condition.  These medications require periodic monitoring to evaluate for efficacy and side effects and may require periodic laboratory monitoring.   Topical steroids (such as triamcinolone, fluocinolone, fluocinonide, mometasone, clobetasol, halobetasol, betamethasone, hydrocortisone) can cause thinning and lightening of the skin if they are used for too long in the same area. Your physician has selected the right strength  medicine for your problem and area affected on the body. Please use your medication  only as directed by your physician to prevent side effects.    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 Condition is symptomatic / bothersome to patient. Not to goal.   Treatment Plan: Start Tretinoin 0.025% cr qhs to face  Topical retinoid medications like tretinoin/Retin-A, adapalene/Differin, tazarotene/Fabior, and Epiduo/Epiduo Forte can cause dryness and irritation when first started. Only apply a pea-sized amount to the entire affected area. Avoid applying it around the eyes, edges of mouth and creases at the nose. If you experience irritation, use a good moisturizer first and/or apply the medicine less often. If you are doing well with the medicine, you can increase how often you use it until you are applying every night. Be careful with sun protection while using this medication as it can make you sensitive to the sun. This medicine should not be used by pregnant women.    LENTIGINES / ACTINIC DAMAGE face Exam: scattered tan macules face Due to sun exposure Treatment Plan: Benign-appearing, observe. Recommend daily broad spectrum sunscreen SPF 30+ to sun-exposed areas, reapply every 2 hours as needed.  Call for any changes  - Counseling for BBL / IPL / Laser and Coordination of Care Discussed the treatment option of Broad Band Light (BBL) /Intense Pulsed Light (IPL)/ Laser for skin discoloration, including brown spots and redness.  Typically we recommend at least 1-3 treatment sessions about 5-8 weeks apart for best results.  Cannot have tanned skin when BBL performed, and regular use of sunscreen/photoprotection is advised after the procedure to help maintain results. The patient's condition may also require "maintenance treatments" in the future.  The fee for BBL / laser treatments is $350 per treatment session for the whole face.  A fee can be quoted for other parts of the body.  Insurance  typically does not pay for BBL/laser treatments and therefore the fee is an out-of-pocket cost. Recommend prophylactic valtrex treatment. Once scheduled for procedure, will send Rx in prior to patient's appointment.   HISTORY OF PRECANCEROUS ACTINIC KERATOSIS - site(s) of PreCancerous Actinic Keratosis clear today. - these may recur and new lesions may form requiring treatment to prevent transformation into skin cancer - observe for new or changing spots and contact [facility] for appointment if occur - photoprotection with sun protective clothing; sunglasses and broad spectrum sunscreen with SPF of at least 30 + and frequent self skin exams recommended - yearly exams by a dermatologist recommended for persons with history of PreCancerous Actinic Keratoses   Return in about 1 year (around 04/12/2024) for TBSE, Hx of BCC, Hx of AKs.  I, Ardis Rowan, RMA, am acting as scribe for Armida Sans, MD .   Documentation: I have reviewed the above documentation for accuracy and completeness, and I agree with the above.  Armida Sans, MD

## 2023-04-14 ENCOUNTER — Encounter: Payer: Self-pay | Admitting: Dermatology

## 2023-06-22 ENCOUNTER — Other Ambulatory Visit: Payer: Self-pay | Admitting: Family Medicine

## 2023-06-22 DIAGNOSIS — Z1231 Encounter for screening mammogram for malignant neoplasm of breast: Secondary | ICD-10-CM

## 2023-06-24 LAB — HM MAMMOGRAPHY

## 2023-07-11 ENCOUNTER — Encounter: Payer: Self-pay | Admitting: *Deleted

## 2023-07-11 DIAGNOSIS — Z1231 Encounter for screening mammogram for malignant neoplasm of breast: Secondary | ICD-10-CM

## 2023-10-28 ENCOUNTER — Other Ambulatory Visit: Payer: Self-pay | Admitting: Acute Care

## 2023-10-28 DIAGNOSIS — Z122 Encounter for screening for malignant neoplasm of respiratory organs: Secondary | ICD-10-CM

## 2023-10-28 DIAGNOSIS — Z87891 Personal history of nicotine dependence: Secondary | ICD-10-CM

## 2023-11-09 ENCOUNTER — Ambulatory Visit
Admission: RE | Admit: 2023-11-09 | Discharge: 2023-11-09 | Disposition: A | Discharge status: Home / Self Care | Source: Ambulatory Visit | Attending: Acute Care | Admitting: Acute Care

## 2023-11-09 ENCOUNTER — Ambulatory Visit

## 2023-11-09 DIAGNOSIS — Z122 Encounter for screening for malignant neoplasm of respiratory organs: Secondary | ICD-10-CM

## 2023-11-09 DIAGNOSIS — Z87891 Personal history of nicotine dependence: Secondary | ICD-10-CM

## 2023-11-16 ENCOUNTER — Other Ambulatory Visit: Payer: Self-pay

## 2023-11-16 MED ORDER — TRETINOIN 0.025 % EX CREA: TOPICAL_CREAM | Freq: Every day | CUTANEOUS | 0 refills | Status: AC

## 2023-11-16 NOTE — Progress Notes (Signed)
 [employer] denied PA for Tretinoin. Patient OK with RX being transferred to [facility] and using [employer] for cheaper pricing. aw

## 2023-12-14 ENCOUNTER — Telehealth: Payer: Self-pay | Admitting: *Deleted

## 2023-12-14 DIAGNOSIS — R911 Solitary pulmonary nodule: Secondary | ICD-10-CM

## 2023-12-14 DIAGNOSIS — Z87891 Personal history of nicotine dependence: Secondary | ICD-10-CM

## 2023-12-14 NOTE — Telephone Encounter (Signed)
 Per Dara Ear, NP - Please call patient and advise that Due to gradual growth of pulmonary nodules a 6 month nodule follow up CT is recommended.

## 2023-12-15 NOTE — Telephone Encounter (Signed)
 Spoke with pt and reviewed CT results per Dara Ear, NP. One nodule has showed small growth and we would like to repeat her scan in 6 months instead of waiting a year. Pt verbalized understanding. Results/ plans faxed to PCP. Order placed for 6 month nodule follow up CT.

## 2024-04-04 ENCOUNTER — Ambulatory Visit (INDEPENDENT_AMBULATORY_CARE_PROVIDER_SITE_OTHER): Admitting: Family Medicine

## 2024-04-04 ENCOUNTER — Other Ambulatory Visit (HOSPITAL_COMMUNITY)
Admission: RE | Admit: 2024-04-04 | Discharge: 2024-04-04 | Disposition: A | Discharge status: Home / Self Care | Source: Ambulatory Visit | Attending: Family Medicine | Admitting: Family Medicine

## 2024-04-04 ENCOUNTER — Encounter: Payer: Self-pay | Admitting: Family Medicine

## 2024-04-04 VITALS — BP 146/71 | HR 68 | Temp 98.4°F | Ht 67.0 in | Wt 166.7 lb

## 2024-04-04 DIAGNOSIS — Z0001 Encounter for general adult medical examination with abnormal findings: Secondary | ICD-10-CM

## 2024-04-04 DIAGNOSIS — M5412 Radiculopathy, cervical region: Secondary | ICD-10-CM

## 2024-04-04 DIAGNOSIS — Z23 Encounter for immunization: Secondary | ICD-10-CM | POA: Diagnosis not present

## 2024-04-04 DIAGNOSIS — R350 Frequency of micturition: Secondary | ICD-10-CM | POA: Diagnosis not present

## 2024-04-04 DIAGNOSIS — Z87891 Personal history of nicotine dependence: Secondary | ICD-10-CM | POA: Diagnosis not present

## 2024-04-04 DIAGNOSIS — Z0000 Encounter for general adult medical examination without abnormal findings: Secondary | ICD-10-CM

## 2024-04-05 ENCOUNTER — Ambulatory Visit: Payer: Self-pay | Admitting: Family Medicine

## 2024-04-05 DIAGNOSIS — N3289 Other specified disorders of bladder: Secondary | ICD-10-CM

## 2024-04-05 LAB — COMPREHENSIVE METABOLIC PANEL WITH GFR
ALT: 15 IU/L (ref 0–32)
AST: 15 IU/L (ref 0–40)
Albumin: 4.2 g/dL (ref 3.9–4.9)
Alkaline Phosphatase: 90 IU/L (ref 44–121)
BUN/Creatinine Ratio: 31 — ABNORMAL HIGH (ref 12–28)
BUN: 22 mg/dL (ref 8–27)
Bilirubin Total: 0.3 mg/dL (ref 0.0–1.2)
CO2: 22 mmol/L (ref 20–29)
Calcium: 9.5 mg/dL (ref 8.7–10.3)
Chloride: 102 mmol/L (ref 96–106)
Creatinine, Ser: 0.7 mg/dL (ref 0.57–1.00)
Globulin, Total: 3 g/dL (ref 1.5–4.5)
Glucose: 102 mg/dL — ABNORMAL HIGH (ref 70–99)
Potassium: 3.9 mmol/L (ref 3.5–5.2)
Sodium: 140 mmol/L (ref 134–144)
Total Protein: 7.2 g/dL (ref 6.0–8.5)
eGFR: 97 mL/min/1.73 (ref >59)

## 2024-04-05 LAB — URINALYSIS, ROUTINE W REFLEX MICROSCOPIC
Bilirubin, UA: NEGATIVE
Glucose, UA: NEGATIVE
Ketones, UA: NEGATIVE
Nitrite, UA: NEGATIVE
Protein,UA: NEGATIVE
RBC, UA: NEGATIVE
Specific Gravity, UA: 1.005 (ref 1.005–1.030)
Urobilinogen, Ur: 0.2 mg/dL (ref 0.2–1.0)
pH, UA: 7 (ref 5.0–7.5)

## 2024-04-05 LAB — MICROSCOPIC EXAMINATION
Bacteria, UA: NONE SEEN
Casts: NONE SEEN /LPF
RBC, Urine: NONE SEEN /HPF (ref 0–2)
WBC, UA: NONE SEEN /HPF (ref 0–5)

## 2024-04-05 LAB — CBC
Hematocrit: 39.3 % (ref 34.0–46.6)
Hemoglobin: 13.3 g/dL (ref 11.1–15.9)
MCH: 31.8 pg (ref 26.6–33.0)
MCHC: 33.8 g/dL (ref 31.5–35.7)
MCV: 94 fL (ref 79–97)
Platelets: 273 x10E3/uL (ref 150–450)
RBC: 4.18 x10E6/uL (ref 3.77–5.28)
RDW: 12.6 % (ref 11.7–15.4)
WBC: 6.8 x10E3/uL (ref 3.4–10.8)

## 2024-04-05 LAB — LIPID PANEL
Chol/HDL Ratio: 7.4 ratio — ABNORMAL HIGH (ref 0.0–4.4)
Cholesterol, Total: 236 mg/dL — ABNORMAL HIGH (ref 100–199)
HDL: 32 mg/dL — ABNORMAL LOW (ref >39)
LDL Chol Calc (NIH): 161 mg/dL — ABNORMAL HIGH (ref 0–99)
Triglycerides: 232 mg/dL — ABNORMAL HIGH (ref 0–149)
VLDL Cholesterol Cal: 43 mg/dL — ABNORMAL HIGH (ref 5–40)

## 2024-04-05 LAB — SPECIMEN STATUS REPORT

## 2024-04-06 LAB — CYTOLOGY - PAP
Comment: NEGATIVE
Diagnosis: NEGATIVE
High risk HPV: NEGATIVE

## 2024-04-06 MED ORDER — SOLIFENACIN SUCCINATE 5 MG PO TABS: 5.0000 mg | ORAL_TABLET | Freq: Every day | ORAL | 2 refills | Status: DC

## 2024-04-16 NOTE — Progress Notes (Signed)
 Complete physical exam   Patient: Shelly Hicks   DOB: [birth year]   63 y.o. Female  MRN: [id number] Visit Date: 04/04/2024  Today's healthcare provider: Nancyann Perry, MD   Chief Complaint  Patient presents with   Annual Exam    Diet-Normal Exercise- At work, walks a lot at work Overall Feeling- Just a little tired, the past week feels like she has a sinus infection Sleep- Fine Concerns- Urge to urine, seem to can't hold it before getting to the bathroom  Colonoscopy- 2023 Pap today   Pneumococcal Vaccine- yes Shingles- yes   Subjective    Discussed the use of AI scribe software for clinical note transcription with the patient, who gave verbal consent to proceed.  History of Present Illness   Shelly Hicks is a 63 year old female who presents for an annual physical exam and reports urinary urgency.  She experiences urinary urgency, characterized by a sudden need to urinate quickly. There is no associated leakage, dysuria, or stinging sensations. This symptom has been present for an unspecified duration. She consumes a significant amount of water during the day and occasionally experiences nocturia.  She had a sinus infection that began last Thursday, which has since improved. There are no current respiratory issues such as dyspnea.  She is due for a Pap test and has no history of abnormal results since she was very young. There is no recent vaginal bleeding or discharge.  She underwent a colonoscopy in 2023, which was normal.  Regarding her lung health, she had a lung screening in April and is scheduled for a follow-up screening in October. She is scheduled for a follow-up screening in October.  No breast lumps, bumps, or sores. No hearing issues or tinnitus.      HPI     Annual Exam    Additional comments: Diet-Normal Exercise- At work, walks a lot at work Overall Feeling- Just a little tired, the past week feels like she has a sinus  infection Sleep- Fine Concerns- Urge to urine, seem to can't hold it before getting to the bathroom  Colonoscopy- 2023 Pap today   Pneumococcal Vaccine- yes Shingles- yes      Last edited by Terrel Powell CROME, CMA on 04/04/2024  2:34 PM.       Past Medical History:  Diagnosis Date   Basal cell carcinoma 03/18/2021   left deltoid, EDC 04/20/21   Diverticulosis    History of chicken pox    History of measles    History of mumps    History of rib fracture    Past Surgical History:  Procedure Laterality Date   SHOULDER SURGERY Right 04/2016   TUBAL LIGATION  2000   Social History   Socioeconomic History   Marital status: Married    Spouse name: Not on file   Number of children: 2   Years of education: Not on file   Highest education level: Not on file  Occupational History   Not on file  Tobacco Use   Smoking status: Former    Current packs/day: 0.00    Average packs/day: 1 pack/day for 30.0 years (30.0 ttl pk-yrs)    Types: Cigarettes, E-cigarettes    Start date: 59    Quit date: 2011    Years since quitting: 14.6   Smokeless tobacco: Never  Vaping Use   Vaping status: Some Days  Substance and Sexual Activity   Alcohol use: Yes    Alcohol/week: 2.0 standard  drinks of alcohol    Types: 2 Glasses of wine per week    Comment: occasional use   Drug use: No   Sexual activity: Yes    Birth control/protection: None  Other Topics Concern   Not on file  Social History Narrative   Not on file   Social Drivers of Health   Financial Resource Strain: Not on file  Food Insecurity: Not on file  Transportation Needs: Not on file  Physical Activity: Not on file  Stress: Not on file  Social Connections: Not on file  Intimate Partner Violence: Not on file   Family Status  Relation Name Status   Mother  Deceased   Father  Deceased at age 32   Brother half brother Alive   Neg Hx  (Not Specified)  No partnership data on file   Family History  Problem  Relation Age of Onset   Hypertension Mother    Heart disease Neg Hx    Breast cancer Neg Hx    Colon cancer Neg Hx    No Known Allergies  Patient Care Team: Gasper Nancyann BRAVO, MD as PCP - General (Family Medicine) Gasper, Nancyann BRAVO, MD as Referring Physician (Family Medicine) System, Provider Not In Hester Alm BROCKS, MD (Dermatology)   Medications: Outpatient Medications Prior to Visit  Medication Sig   cetirizine  (ZYRTEC ) 10 MG tablet Take 1 tablet (10 mg total) by mouth daily. (Patient taking differently: Take 10 mg by mouth as needed for allergies or rhinitis.)   fluticasone  (FLONASE ) 50 MCG/ACT nasal spray Place 2 sprays into both nostrils daily.   mometasone  (ELOCON ) 0.1 % lotion Apply topically as directed. qd up to 5 days a week to aa psoriasis on scalp prn flares   montelukast  (SINGULAIR ) 10 MG tablet Take 1 tablet (10 mg total) by mouth at bedtime. (Patient taking differently: Take 10 mg by mouth as needed.)   tretinoin  (RETIN-A ) 0.025 % cream Apply topically at bedtime. Pea size amount to face nightly as tolerated for acne   No facility-administered medications prior to visit.    Review of Systems  Constitutional:  Negative for appetite change, chills, fatigue and fever.  Respiratory:  Negative for chest tightness and shortness of breath.   Cardiovascular:  Negative for chest pain and palpitations.  Gastrointestinal:  Negative for abdominal pain, nausea and vomiting.  Neurological:  Negative for dizziness and weakness.      Objective    BP (!) 146/71 (BP Location: Left Arm, Patient Position: Sitting, Cuff Size: Normal)   Pulse 68   Temp 98.4 F (36.9 C) (Oral)   Ht 5' 7 (1.702 m)   Wt 166 lb 11.2 oz (75.6 kg)   LMP 08/20/2010 (Approximate)   SpO2 100%   BMI 26.11 kg/m    Physical Exam  General Appearance:    Well developed, well nourished female. Alert, cooperative, in no acute distress, appears stated age   Head:    Normocephalic, without obvious  abnormality, atraumatic  Eyes:    PERRL, conjunctiva/corneas clear, EOM's intact, fundi    benign, both eyes  Ears:    Normal TM's and external ear canals, both ears  Nose:   Nares normal, septum midline, mucosa normal, no drainage    or sinus tenderness  Throat:   Lips, mucosa, and tongue normal; teeth and gums normal  Neck:   Supple, symmetrical, trachea midline, no adenopathy;    thyroid:  no enlargement/tenderness/nodules; no carotid   bruit or JVD  Back:  Symmetric, no curvature, ROM normal, no CVA tenderness  Lungs:     Clear to auscultation bilaterally, respirations unlabored  Chest Wall:    No tenderness or deformity   Heart:    Normal heart rate. Normal rhythm. No murmurs, rubs, or gallops.   Breast Exam:    deferred  Abdomen:     Soft, non-tender, bowel sounds active all four quadrants,    no masses, no organomegaly  Pelvic:    deferred  Extremities:   All extremities are intact. No cyanosis or edema  Pulses:   2+ and symmetric all extremities  Skin:   Skin color, texture, turgor normal, no rashes or lesions  Lymph nodes:   Cervical, supraclavicular, and axillary nodes normal  Neurologic:   CNII-XII intact, normal strength, sensation and reflexes    throughout       Last depression screening scores    04/04/2024    2:37 PM 03/16/2022    2:07 PM 03/13/2021    2:05 PM  PHQ 2/9 Scores  PHQ - 2 Score 3 0 0  PHQ- 9 Score 9 0    Last fall risk screening    04/04/2024    2:37 PM  Fall Risk   Falls in the past year? 0  Number falls in past yr: 0  Injury with Fall? 0  Risk for fall due to : No Fall Risks   Last Audit-C alcohol use screening    03/16/2022    2:07 PM  Alcohol Use Disorder Test (AUDIT)  1. How often do you have a drink containing alcohol? 2  2. How many drinks containing alcohol do you have on a typical day when you are drinking? 0  3. How often do you have six or more drinks on one occasion? 0  AUDIT-C Score 2   A score of 3 or more in women, and 4  or more in men indicates increased risk for alcohol abuse, EXCEPT if all of the points are from question 1   Results for orders placed or performed in visit on 04/04/24  Microscopic Examination   Urine  Result Value Ref Range   WBC, UA None seen 0 - 5 /hpf   RBC, Urine None seen 0 - 2 /hpf   Epithelial Cells (non renal) 0-10 0 - 10 /hpf   Casts None seen None seen /lpf   Bacteria, UA None seen None seen/Few  CBC  Result Value Ref Range   WBC 6.8 3.4 - 10.8 x10E3/uL   RBC 4.18 3.77 - 5.28 x10E6/uL   Hemoglobin 13.3 11.1 - 15.9 g/dL   Hematocrit 60.6 65.9 - 46.6 %   MCV 94 79 - 97 fL   MCH 31.8 26.6 - 33.0 pg   MCHC 33.8 31.5 - 35.7 g/dL   RDW 87.3 88.2 - 84.5 %   Platelets 273 150 - 450 x10E3/uL  Comprehensive metabolic panel with GFR  Result Value Ref Range   Glucose 102 (H) 70 - 99 mg/dL   BUN 22 8 - 27 mg/dL   Creatinine, Ser 9.29 0.57 - 1.00 mg/dL   eGFR 97 >40 fO/fpw/8.26   BUN/Creatinine Ratio 31 (H) 12 - 28   Sodium 140 134 - 144 mmol/L   Potassium 3.9 3.5 - 5.2 mmol/L   Chloride 102 96 - 106 mmol/L   CO2 22 20 - 29 mmol/L   Calcium 9.5 8.7 - 10.3 mg/dL   Total Protein 7.2 6.0 - 8.5 g/dL   Albumin 4.2 3.9 -  4.9 g/dL   Globulin, Total 3.0 1.5 - 4.5 g/dL   Bilirubin Total 0.3 0.0 - 1.2 mg/dL   Alkaline Phosphatase 90 44 - 121 IU/L   AST 15 0 - 40 IU/L   ALT 15 0 - 32 IU/L  Lipid panel  Result Value Ref Range   Cholesterol, Total 236 (H) 100 - 199 mg/dL   Triglycerides 767 (H) 0 - 149 mg/dL   HDL 32 (L) >60 mg/dL   VLDL Cholesterol Cal 43 (H) 5 - 40 mg/dL   LDL Chol Calc (NIH) 838 (H) 0 - 99 mg/dL   Chol/HDL Ratio 7.4 (H) 0.0 - 4.4 ratio  Urinalysis, Routine w reflex microscopic  Result Value Ref Range   Specific Gravity, UA 1.005 1.005 - 1.030   pH, UA 7.0 5.0 - 7.5   Color, UA Yellow Yellow   Appearance Ur Clear Clear   Leukocytes,UA Trace (A) Negative   Protein,UA Negative Negative/Trace   Glucose, UA Negative Negative   Ketones, UA Negative Negative    RBC, UA Negative Negative   Bilirubin, UA Negative Negative   Urobilinogen, Ur 0.2 0.2 - 1.0 mg/dL   Nitrite, UA Negative Negative   Microscopic Examination See below:   Specimen status report  Result Value Ref Range   specimen status report Comment   Cytology - PAP  Result Value Ref Range   High risk HPV Negative    Adequacy      Satisfactory for evaluation. The presence or absence of an   Adequacy      endocervical/transformation zone component cannot be determined because   Adequacy of atrophy.    Diagnosis      - Negative for intraepithelial lesion or malignancy (NILM)   Comment Inflammation and atrophic changes are present.    Comment Normal Reference Range HPV - Negative     Assessment & Plan    Routine Health Maintenance and Physical Exam  Exercise Activities and Dietary recommendations  Goals   None     Immunization History  Administered Date(s) Administered   Influenza Inj Mdck Quad Pf 07/29/2018   Influenza-Unspecified 05/30/2015   PNEUMOCOCCAL CONJUGATE-20 04/04/2024   Td 12/27/2016   Tdap 04/27/2006   Zoster Recombinant(Shingrix ) 04/04/2024    Health Maintenance  Topic Date Due   COVID-19 Vaccine (1) Never done   HIV Screening  Never done   Colonoscopy  10/11/2020   Influenza Vaccine  03/09/2024   Lung Cancer Screening  05/10/2024   Zoster Vaccines- Shingrix  (2 of 2) 05/30/2024   MAMMOGRAM  06/23/2024   DTaP/Tdap/Td (3 - Td or Tdap) 12/28/2026   Cervical Cancer Screening (HPV/Pap Cotest)  04/04/2029   Pneumococcal Vaccine: 50+ Years  Completed   Hepatitis C Screening  Completed   Hepatitis B Vaccines 19-59 Average Risk  Aged Out   HPV VACCINES  Aged Out   Meningococcal B Vaccine  Aged Out    Discussed health benefits of physical activity, and encouraged her to engage in regular exercise appropriate for her age and condition.  1. Annual physical exam (Primary)  - CBC - Comprehensive metabolic panel with GFR - Lipid panel  2. Radiculopathy  of cervical region  - Cytology - PAP with HPV  3. History of smoking 30 or more pack years Continue annual LCS  4. Urine frequency  - Urinalysis, Routine w reflex microscopic  Likely overactive bladder. Consider trial of anti-cholinergic if urine microscopic exam is negative.   5. Need for shingles vaccine  - Administer Zoster,  Recombinant (Shingrix ) Vaccine  6. Need for vaccination against Streptococcus pneumoniae  - Pneumococcal conjugate vaccine 20-valent (PCV20)        Nancyann Perry, MD  [facility] [phone] (phone) [phone] (fax)  [facility]

## 2024-04-18 ENCOUNTER — Ambulatory Visit: Payer: 59 | Admitting: Dermatology

## 2024-04-18 DIAGNOSIS — Z7189 Other specified counseling: Secondary | ICD-10-CM

## 2024-04-18 DIAGNOSIS — Z85828 Personal history of other malignant neoplasm of skin: Secondary | ICD-10-CM

## 2024-04-18 DIAGNOSIS — L814 Other melanin hyperpigmentation: Secondary | ICD-10-CM | POA: Diagnosis not present

## 2024-04-18 DIAGNOSIS — L570 Actinic keratosis: Secondary | ICD-10-CM

## 2024-04-18 DIAGNOSIS — Z1283 Encounter for screening for malignant neoplasm of skin: Secondary | ICD-10-CM | POA: Diagnosis not present

## 2024-04-18 DIAGNOSIS — L578 Other skin changes due to chronic exposure to nonionizing radiation: Secondary | ICD-10-CM | POA: Diagnosis not present

## 2024-04-18 DIAGNOSIS — L821 Other seborrheic keratosis: Secondary | ICD-10-CM

## 2024-04-18 DIAGNOSIS — W908XXA Exposure to other nonionizing radiation, initial encounter: Secondary | ICD-10-CM

## 2024-04-18 DIAGNOSIS — D692 Other nonthrombocytopenic purpura: Secondary | ICD-10-CM

## 2024-04-18 DIAGNOSIS — M720 Palmar fascial fibromatosis [Dupuytren]: Secondary | ICD-10-CM

## 2024-04-18 DIAGNOSIS — D1801 Hemangioma of skin and subcutaneous tissue: Secondary | ICD-10-CM

## 2024-04-18 DIAGNOSIS — D229 Melanocytic nevi, unspecified: Secondary | ICD-10-CM

## 2024-04-18 NOTE — Progress Notes (Unsigned)
 Follow-Up Visit   Subjective  Shelly Hicks is a 63 y.o. female who presents for the following: Skin Cancer Screening and Full Body Skin Exam  Hx of BCC 03/18/21 L Deltoid, EDC 04/20/21  C/o spot on Right cheek bone and knot on L Palm  The patient presents for Total-Body Skin Exam (TBSE) for skin cancer screening and mole check. The patient has spots, moles and lesions to be evaluated, some may be new or changing and the patient may have concern these could be cancer.  The following portions of the chart were reviewed this encounter and updated as appropriate: medications, allergies, medical history  Review of Systems:  No other skin or systemic complaints except as noted in HPI or Assessment and Plan.  Objective  Well appearing patient in no apparent distress; mood and affect are within normal limits.  A full examination was performed including scalp, head, eyes, ears, nose, lips, neck, chest, axillae, abdomen, back, buttocks, bilateral upper extremities, bilateral lower extremities, hands, feet, fingers, toes, fingernails, and toenails. All findings within normal limits unless otherwise noted below.   Relevant physical exam findings are noted in the Assessment and Plan.  Right Zygomatic Area Erythematous thin papules/macules with gritty scale.   Assessment & Plan   SKIN CANCER SCREENING PERFORMED TODAY.  ACTINIC DAMAGE - Chronic condition, secondary to cumulative UV/sun exposure - diffuse scaly erythematous macules with underlying dyspigmentation - Recommend daily broad spectrum sunscreen SPF 30+ to sun-exposed areas, reapply every 2 hours as needed.  - Staying in the shade or wearing long sleeves, sun glasses (UVA+UVB protection) and wide brim hats (4-inch brim around the entire circumference of the hat) are also recommended for sun protection.  - Call for new or changing lesions.  LENTIGINES, SEBORRHEIC KERATOSES, HEMANGIOMAS - Benign normal skin lesions -  Benign-appearing - Call for any changes  MELANOCYTIC NEVI - Tan-brown and/or pink-flesh-colored symmetric macules and papules - Benign appearing on exam today - Observation - Call clinic for new or changing moles - Recommend daily use of broad spectrum spf 30+ sunscreen to sun-exposed areas.   HISTORY OF BASAL CELL CARCINOMA OF THE SKIN  L Deltoid - No evidence of recurrence today - Recommend regular full body skin exams - Recommend daily broad spectrum sunscreen SPF 30+ to sun-exposed areas, reapply every 2 hours as needed.  - Call if any new or changing lesions are noted between office visits  Duypuytrens Contracture L Palm Treatment Plan: Discussed referral to orthopedics, declines today  Purpura - Chronic; persistent and recurrent.  Treatable, but not curable. Bilateral Arms - Violaceous macules and patches - Benign - Related to trauma, age, sun damage and/or use of blood thinners, chronic use of topical and/or oral steroids - Observe - Can use OTC arnica containing moisturizer such as Dermend Bruise Formula if desired - Call for worsening or other concerns  AK (ACTINIC KERATOSIS) Right Zygomatic Area Destruction of lesion - Right Zygomatic Area Complexity: simple   Destruction method: cryotherapy   Informed consent: discussed and consent obtained   Timeout:  patient name, date of birth, surgical site, and procedure verified Lesion destroyed using liquid nitrogen: Yes   Region frozen until ice ball extended beyond lesion: Yes   Outcome: patient tolerated procedure well with no complications   Post-procedure details: wound care instructions given    Return in about 1 year (around 04/18/2025) for tbse.  I, Gordan Beams, CMA, am acting as scribe for Alm Rhyme, MD.   Documentation: I have reviewed  the above documentation for accuracy and completeness, and I agree with the above.  Alm Rhyme, MD

## 2024-04-18 NOTE — Patient Instructions (Signed)

## 2024-04-19 ENCOUNTER — Encounter: Payer: Self-pay | Admitting: Dermatology

## 2024-06-06 ENCOUNTER — Ambulatory Visit (INDEPENDENT_AMBULATORY_CARE_PROVIDER_SITE_OTHER): Admitting: Family Medicine

## 2024-06-06 DIAGNOSIS — Z23 Encounter for immunization: Secondary | ICD-10-CM | POA: Diagnosis not present

## 2024-06-06 NOTE — Progress Notes (Signed)
Patient here for second shingles vaccine. 

## 2024-06-22 ENCOUNTER — Ambulatory Visit: Payer: Self-pay

## 2024-06-22 DIAGNOSIS — N3289 Other specified disorders of bladder: Secondary | ICD-10-CM

## 2024-06-22 NOTE — Telephone Encounter (Signed)
 FYI Only or Action Required?: Action required by provider: medication refill request and clinical question for provider.  Patient was last seen in primary care on 06/06/2024 by Gasper Nancyann BRAVO, MD.  Called Nurse Triage reporting Medication Problem.  Symptoms began several days ago.  Interventions attempted: Prescription medications: vesicare .  Symptoms are: gradually worsening.  Triage Disposition: No disposition on file.  Patient/caregiver understands and will follow disposition?:   Answer Assessment - Initial Assessment Questions Patient requesting alternative medication.  1. NAME of MEDICINE: What medicine(s) are you calling about?     Vesicare  5 mg 2. QUESTION: What is your question? (e.g., double dose of medicine, side effect)     Medication not working 3. PRESCRIBER: Who prescribed the medicine? Reason: if prescribed by specialist, call should be referred to that group.     Dr. Nancyann Gasper 4. SYMPTOMS: Do you have any symptoms? If Yes, ask: What symptoms are you having?  How bad are the symptoms (e.g., mild, moderate, severe)     Patient reports: Medication not working, going more at night then I ever have 2-3 times at night; get sudden urges to go and can't hold it. Denies pain, fever, chills, n/v  Advised call back or UC/ED if symptoms worsen.  Protocols used: Medication Question Call-A-AH Message from [place] sent at 06/22/2024 10:32 AM EST  Summary: rx concern   Reason for Triage: The patient has called to share that they are still experiencing bladder concerns and would like to please discuss an alternative to their current prescription for solifenacin  (VESICARE ) 5 MG tablet [[id number]]

## 2024-06-26 NOTE — Telephone Encounter (Signed)
 She can try increasing to 10mg  a day of Vesicare  by taking 2 5mg  tablets. If that doesn't help then she will need to see urologist of further evaluation.

## 2024-06-27 NOTE — Telephone Encounter (Signed)
 Spoke with cindi and advised recommendation. Pt verbalized understanding.

## 2024-07-04 ENCOUNTER — Ambulatory Visit
Admission: RE | Admit: 2024-07-04 | Discharge: 2024-07-04 | Disposition: A | Discharge status: Home / Self Care | Source: Ambulatory Visit | Attending: Acute Care | Admitting: Acute Care

## 2024-07-04 ENCOUNTER — Ambulatory Visit

## 2024-07-04 DIAGNOSIS — R911 Solitary pulmonary nodule: Secondary | ICD-10-CM

## 2024-07-04 DIAGNOSIS — Z87891 Personal history of nicotine dependence: Secondary | ICD-10-CM

## 2024-07-15 ENCOUNTER — Other Ambulatory Visit: Payer: Self-pay | Admitting: Family Medicine

## 2024-07-15 DIAGNOSIS — N3289 Other specified disorders of bladder: Secondary | ICD-10-CM

## 2024-07-19 ENCOUNTER — Other Ambulatory Visit: Payer: Self-pay | Admitting: Acute Care

## 2024-07-19 DIAGNOSIS — Z87891 Personal history of nicotine dependence: Secondary | ICD-10-CM

## 2024-07-19 DIAGNOSIS — Z122 Encounter for screening for malignant neoplasm of respiratory organs: Secondary | ICD-10-CM

## 2025-04-24 ENCOUNTER — Ambulatory Visit: Admitting: Dermatology
